# Patient Record
Sex: Female | Born: 1960 | Race: White | Hispanic: No | Marital: Single | Smoking: Current every day smoker
Health system: Southern US, Community
[De-identification: ages and names within clinical notes are randomized; demographics above are authoritative.]

## PROBLEM LIST (undated history)

## (undated) DIAGNOSIS — N200 Calculus of kidney: Secondary | ICD-10-CM

## (undated) DIAGNOSIS — F419 Anxiety disorder, unspecified: Secondary | ICD-10-CM

## (undated) DIAGNOSIS — R51 Headache: Secondary | ICD-10-CM

## (undated) DIAGNOSIS — M1990 Unspecified osteoarthritis, unspecified site: Secondary | ICD-10-CM

## (undated) DIAGNOSIS — M5441 Lumbago with sciatica, right side: Secondary | ICD-10-CM

## (undated) DIAGNOSIS — D519 Vitamin B12 deficiency anemia, unspecified: Secondary | ICD-10-CM

## (undated) DIAGNOSIS — Z8489 Family history of other specified conditions: Secondary | ICD-10-CM

## (undated) DIAGNOSIS — R519 Headache, unspecified: Secondary | ICD-10-CM

## (undated) DIAGNOSIS — I10 Essential (primary) hypertension: Secondary | ICD-10-CM

## (undated) DIAGNOSIS — F4321 Adjustment disorder with depressed mood: Secondary | ICD-10-CM

## (undated) DIAGNOSIS — C801 Malignant (primary) neoplasm, unspecified: Secondary | ICD-10-CM

## (undated) HISTORY — DX: Lumbago with sciatica, right side: M54.41

## (undated) HISTORY — DX: Vitamin B12 deficiency anemia, unspecified: D51.9

## (undated) HISTORY — DX: Adjustment disorder with depressed mood: F43.21

## (undated) HISTORY — PX: LITHOTRIPSY: SUR834

## (undated) HISTORY — PX: TUBAL LIGATION: SHX77

---

## 2015-02-11 ENCOUNTER — Ambulatory Visit: Payer: Self-pay | Admitting: Orthopedic Surgery

## 2015-02-11 NOTE — H&P (Signed)
Krystal Ellis is an 54 y.o. female.   Chief Complaint: back and leg pain HPI: The patient is a 54 year old female who presents with back pain. The patient is here today in referral from Dr. Nelva Bush. The patient reports low back symptoms including low back pain which began year(s) ago without any known injury. Symptoms are reported to be located in the low back and Symptoms include pain and numbness (right toes). The pain radiates to the right buttock, right posterior thigh, right posterior lower leg and right foot. The patient describes the pain as sharp, dull and aching. The patient describes the severity of their symptoms as 5 / 10. Symptoms are exacerbated by standing (prolonged). Current treatment includes nonsteroidal anti-inflammatory drugs (Ibuprofen, Gabapentin, and Lorzone prescribed by her primary care physician) and ice packs. Prior to being seen today the patient was previously evaluated by Dr. Nelva Bush. Past evaluation has included MRI of the lumbar spine. Past treatment has included nonsteroidal anti-inflammatory drugs, opioid analgesics, corticosteroids, activity modification, ice packs, heating pad and chiropractic manipulation.  Krystal Ellis reports some back pain, but predominantly buttock and leg pain into the outer aspect of the foot. She has had an MRI which is indicated disc degeneration at L4-L5 and a disc herniation at L5-S1. She has had epidural steroid injections, activity modifications, strategies to avoid injury. This has been two years in duration. She has been taking gabapentin. Again, reporting more leg pain than back pain.  REVIEW OF SYSTEMS Review of systems is negative for fevers, chest pain, shortness of breath, unexplained recent weight loss, loss of bowel or bladder function, burning with urination, joint swelling, rashes, weakness or numbness, difficulty with balance, easy bruising, excessive thirst or frequent urination.  The patient's pain drawing is organic with an S1  dermatomal pain on the right, rates her pain is 5/10 on visual analog scale.  No past medical history on file.  No past surgical history on file.  No family history on file. Social History:  has no tobacco, alcohol, and drug history on file.  Allergies: Allergies not on file   (Not in a hospital admission)  No results found for this or any previous visit (from the past 48 hour(s)). No results found.  Review of Systems  Constitutional: Negative.   HENT: Negative.   Eyes: Negative.   Respiratory: Negative.   Cardiovascular: Negative.   Gastrointestinal: Negative.   Genitourinary: Negative.   Musculoskeletal: Positive for back pain.  Skin: Negative.   Neurological: Positive for sensory change and focal weakness.  Psychiatric/Behavioral: Negative.     There were no vitals taken for this visit. Physical Exam  Constitutional: She appears well-developed.  HENT:  Head: Normocephalic.  Eyes: Pupils are equal, round, and reactive to light.  Neck: Normal range of motion.  Cardiovascular: Normal rate.   Respiratory: Effort normal.  GI: Soft.  Musculoskeletal:  Moderate stress, walks with an antalgic gait, mood and affect is anxious. Tender in the right proximal gluteus. Straight leg raise buttock, thigh and calf pain on the right, negative on the left. Decreased Achilles reflex on the right compared to the left. Diminished plantar flexion on the right. Pain increased with extension and with sitting.  Lumbar spine exam reveals no evidence of soft tissue swelling, deformity or skin ecchymosis. On palpation there is no tenderness of the lumbar spine. No flank pain with percussion. The abdomen is soft and nontender. Nontender over the trochanters. No cellulitis or lymphadenopathy.  Good range of motion of the lumbar  spine without associated pain. Straight leg raise is negative. Motor is 5/5 including EHL, tibialis anterior, plantar flexion, quadriceps and hamstrings. Patient is  normoreflexic. There is no Babinski or clonus. Sensory exam is intact to light touch. Patient has good distal pulses. No DVT. No pain and normal range of motion without instability of the hips, knees and ankles.  Neurological: She is alert. She displays abnormal reflex.  Skin: Skin is warm and dry.    Three radiographs AP, lateral, flexion and extension demonstrate minimal listhesis at L4-L5 associated disc degeneration of L5-S1, hips are unremarkable.  MRI demonstrates minimal disc degeneration at L4-L5 with a minimal listhesis, small disc protrusion, facet hypertrophy. L5-S1, there is a disc herniation central to the left with bilateral S1 nerve root effacement and facet hypertrophy.  Assessment/Plan 1. 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 2. 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  I had extensive discussion with Davisha concerning current pathology and relevant anatomy and treatment options. She is living with this about two years duration. Treatment and options were discussed, one living with the symptoms and pain management. She does not want to continue to live with this due to the severity of the pain, other options epidural steroid injections, she is not interested in that either. Other operative choice is a microlumbar decompression L5-S1 to the right and perhaps bilaterally given the nature of the disc herniation at L5-S1. There is no L5 nerve root involvement though she does have a disc protrusion at L4-L5. We discussed if her symptoms change it may require procedure at that level. She does not have overwhelming leg pain as opposed to back pain. We did discuss therefore no fusion but the possibility of recurrent disc herniation and requirement for fusion in the future and the need to manage left over symptoms related to that. This  would not address any back pain and could have continued pain given the duration of her symptoms. She is understanding of above and would like to proceed. We will have her scheduled for that procedure. No history of MRSA infection or allergy to penicillin or DVT. We will proceed with a lumbar decompression. I appreciate the kind referral by Dr. Nelva Bush.  Plan microlumbar decompression L5-S1 right, possible left  Trevonne Nyland M. PA-C for Dr. Tonita Cong 02/11/2015, 4:57 PM

## 2015-02-11 NOTE — Progress Notes (Signed)
Please put orders in EPIC as patient has pre-op appointment tomorrow 02/12/2015 at 1100 am! Thank you!

## 2015-02-11 NOTE — Patient Instructions (Addendum)
Sokhna Christoph  02/11/2015   Your procedure is scheduled on: 02/17/2015    Report to [facility]  Entrance take [place]  elevators to 3rd floor to  [place] at     [place] AM.  Call this number if you have problems the morning of surgery [phone]   Remember: ONLY 1 PERSON MAY GO WITH YOU TO SHORT STAY TO GET  READY MORNING OF [place].  Do not eat food or drink liquids :After Midnight.     Take these medicines the morning of surgery with A SIP OF WATER:  Prozac   DO NOT TAKE ANY DIABETIC MEDICATIONS DAY OF YOUR SURGERY                               You may not have any metal on your body including hair pins and              piercings  Do not wear jewelry, make-up, lotions, powders or perfumes, deodorant             Do not wear nail polish.  Do not shave  48 hours prior to surgery.                Do not bring valuables to the hospital. [place].  Contacts, dentures or bridgework may not be worn into surgery.  Leave suitcase in the car. After surgery it may be brought to your room.       Special Instructions: coughing and deep breathing exercises, leg exercises               Please read over the following fact sheets you were given: _____________________________________________________________________             [facility] - Preparing for Surgery Before surgery, you can play an important role.  Because skin is not sterile, your skin needs to be as free of germs as possible.  You can reduce the number of germs on your skin by washing with CHG (chlorahexidine gluconate) soap before surgery.  CHG is an antiseptic cleaner which kills germs and bonds with the skin to continue killing germs even after washing. Please DO NOT use if you have an allergy to CHG or antibacterial soaps.  If your skin becomes reddened/irritated stop using the CHG and inform your nurse when you arrive at Short Stay. Do not  shave (including legs and underarms) for at least 48 hours prior to the first CHG shower.  You may shave your face/neck. Please follow these instructions carefully:  1.  Shower with CHG Soap the night before surgery and the  morning of Surgery.  2.  If you choose to wash your hair, wash your hair first as usual with your  normal  shampoo.  3.  After you shampoo, rinse your hair and body thoroughly to remove the  shampoo.                           4.  Use CHG as you would any other liquid soap.  You can apply chg directly  to the skin and wash  Gently with a scrungie or clean washcloth.  5.  Apply the CHG Soap to your body ONLY FROM THE NECK DOWN.   Do not use on face/ open                           Wound or open sores. Avoid contact with eyes, ears mouth and genitals (private parts).                       Wash face,  Genitals (private parts) with your normal soap.             6.  Wash thoroughly, paying special attention to the area where your surgery  will be performed.  7.  Thoroughly rinse your body with warm water from the neck down.  8.  DO NOT shower/wash with your normal soap after using and rinsing off  the CHG Soap.                9.  Pat yourself dry with a clean towel.            10.  Wear clean pajamas.            11.  Place clean sheets on your bed the night of your first shower and do not  sleep with pets. Day of Surgery : Do not apply any lotions/deodorants the morning of surgery.  Please wear clean clothes to the hospital/surgery center.  FAILURE TO FOLLOW THESE INSTRUCTIONS MAY RESULT IN THE CANCELLATION OF YOUR SURGERY PATIENT SIGNATURE_________________________________  NURSE SIGNATURE__________________________________  ________________________________________________________________________   Adam Phenix  An incentive spirometer is a tool that can help keep your lungs clear and active. This tool measures how well you are filling your lungs  with each breath. Taking long deep breaths may help reverse or decrease the chance of developing breathing (pulmonary) problems (especially infection) following:  A long period of time when you are unable to move or be active. BEFORE THE PROCEDURE   If the spirometer includes an indicator to show your best effort, your nurse or respiratory therapist will set it to a desired goal.  If possible, sit up straight or lean slightly forward. Try not to slouch.  Hold the incentive spirometer in an upright position. INSTRUCTIONS FOR USE  1. Sit on the edge of your bed if possible, or sit up as far as you can in bed or on a chair. 2. Hold the incentive spirometer in an upright position. 3. Breathe out normally. 4. Place the mouthpiece in your mouth and seal your lips tightly around it. 5. Breathe in slowly and as deeply as possible, raising the piston or the ball toward the top of the column. 6. Hold your breath for 3-5 seconds or for as long as possible. Allow the piston or ball to fall to the bottom of the column. 7. Remove the mouthpiece from your mouth and breathe out normally. 8. Rest for a few seconds and repeat Steps 1 through 7 at least 10 times every 1-2 hours when you are awake. Take your time and take a few normal breaths between deep breaths. 9. The spirometer may include an indicator to show your best effort. Use the indicator as a goal to work toward during each repetition. 10. After each set of 10 deep breaths, practice coughing to be sure your lungs are clear. If you have an incision (the cut made at the time of surgery),  support your incision when coughing by placing a pillow or rolled up towels firmly against it. Once you are able to get out of bed, walk around indoors and cough well. You may stop using the incentive spirometer when instructed by your caregiver.  RISKS AND COMPLICATIONS  Take your time so you do not get dizzy or light-headed.  If you are in pain, you may need to  take or ask for pain medication before doing incentive spirometry. It is harder to take a deep breath if you are having pain. AFTER USE  Rest and breathe slowly and easily.  It can be helpful to keep track of a log of your progress. Your caregiver can provide you with a simple table to help with this. If you are using the spirometer at home, follow these instructions: [place] IF:   You are having difficultly using the spirometer.  You have trouble using the spirometer as often as instructed.  Your pain medication is not giving enough relief while using the spirometer.  You develop fever of 100.5 F (38.1 C) or higher. SEEK IMMEDIATE MEDICAL CARE IF:   You cough up bloody sputum that had not been present before.  You develop fever of 102 F (38.9 C) or greater.  You develop worsening pain at or near the incision site. MAKE SURE YOU:   Understand these instructions.  Will watch your condition.  Will get help right away if you are not doing well or get worse. Document Released: 08/07/2006 Document Revised: 06/19/2011 Document Reviewed: 10/08/2006 [facility] Patient Information 2014 [place], [place].   ________________________________________________________________________

## 2015-02-12 ENCOUNTER — Ambulatory Visit (HOSPITAL_COMMUNITY)
Admission: RE | Admit: 2015-02-12 | Discharge: 2015-02-12 | Disposition: A | Discharge status: Home / Self Care | Payer: BC Managed Care – PPO | Source: Ambulatory Visit | Attending: Orthopedic Surgery | Admitting: Orthopedic Surgery

## 2015-02-12 ENCOUNTER — Encounter (HOSPITAL_COMMUNITY)
Admission: RE | Admit: 2015-02-12 | Discharge: 2015-02-12 | Disposition: A | Discharge status: Home / Self Care | Payer: BC Managed Care – PPO | Source: Ambulatory Visit | Attending: Specialist | Admitting: Specialist

## 2015-02-12 ENCOUNTER — Encounter (HOSPITAL_COMMUNITY): Payer: Self-pay

## 2015-02-12 DIAGNOSIS — M4316 Spondylolisthesis, lumbar region: Secondary | ICD-10-CM | POA: Insufficient documentation

## 2015-02-12 DIAGNOSIS — M5136 Other intervertebral disc degeneration, lumbar region: Secondary | ICD-10-CM | POA: Insufficient documentation

## 2015-02-12 DIAGNOSIS — Z01812 Encounter for preprocedural laboratory examination: Secondary | ICD-10-CM | POA: Diagnosis not present

## 2015-02-12 DIAGNOSIS — M5126 Other intervertebral disc displacement, lumbar region: Secondary | ICD-10-CM | POA: Insufficient documentation

## 2015-02-12 DIAGNOSIS — Z01818 Encounter for other preprocedural examination: Secondary | ICD-10-CM | POA: Diagnosis not present

## 2015-02-12 DIAGNOSIS — R9431 Abnormal electrocardiogram [ECG] [EKG]: Secondary | ICD-10-CM | POA: Diagnosis not present

## 2015-02-12 HISTORY — DX: Calculus of kidney: N20.0

## 2015-02-12 HISTORY — DX: Malignant (primary) neoplasm, unspecified: C80.1

## 2015-02-12 HISTORY — DX: Unspecified osteoarthritis, unspecified site: M19.90

## 2015-02-12 HISTORY — DX: Family history of other specified conditions: Z84.89

## 2015-02-12 HISTORY — DX: Essential (primary) hypertension: I10

## 2015-02-12 HISTORY — DX: Headache: R51

## 2015-02-12 HISTORY — DX: Anxiety disorder, unspecified: F41.9

## 2015-02-12 HISTORY — DX: Headache, unspecified: R51.9

## 2015-02-12 LAB — BASIC METABOLIC PANEL
Anion gap: 8 (ref 5–15)
BUN: 15 mg/dL (ref 6–20)
CO2: 26 mmol/L (ref 22–32)
Calcium: 9.6 mg/dL (ref 8.9–10.3)
Chloride: 103 mmol/L (ref 101–111)
Creatinine, Ser: 0.57 mg/dL (ref 0.44–1.00)
GFR calc Af Amer: >60 mL/min (ref >60)
Glucose, Bld: 98 mg/dL (ref 65–99)
POTASSIUM: 4 mmol/L (ref 3.5–5.1)
SODIUM: 137 mmol/L (ref 135–145)

## 2015-02-12 LAB — CBC
HEMATOCRIT: 39.4 % (ref 36.0–46.0)
Hemoglobin: 13.3 g/dL (ref 12.0–15.0)
MCH: 31.3 pg (ref 26.0–34.0)
MCHC: 33.8 g/dL (ref 30.0–36.0)
MCV: 92.7 fL (ref 78.0–100.0)
PLATELETS: 304 10*3/uL (ref 150–400)
RBC: 4.25 MIL/uL (ref 3.87–5.11)
RDW: 12.6 % (ref 11.5–15.5)
WBC: 8.6 10*3/uL (ref 4.0–10.5)

## 2015-02-12 LAB — SURGICAL PCR SCREEN
MRSA, PCR: NEGATIVE
Staphylococcus aureus: NEGATIVE

## 2015-02-12 LAB — HCG, SERUM, QUALITATIVE: Preg, Serum: NEGATIVE

## 2015-02-15 NOTE — Progress Notes (Signed)
Final EKG in EPIC done 02/12/15.

## 2015-02-17 ENCOUNTER — Ambulatory Visit (HOSPITAL_COMMUNITY): Payer: BC Managed Care – PPO

## 2015-02-17 ENCOUNTER — Encounter (HOSPITAL_COMMUNITY): Payer: Self-pay | Admitting: *Deleted

## 2015-02-17 ENCOUNTER — Ambulatory Visit (HOSPITAL_COMMUNITY): Payer: BC Managed Care – PPO | Admitting: Anesthesiology

## 2015-02-17 ENCOUNTER — Encounter (HOSPITAL_COMMUNITY)
Admission: RE | Disposition: A | Discharge status: Home / Self Care | Payer: Self-pay | Source: Ambulatory Visit | Attending: Specialist

## 2015-02-17 ENCOUNTER — Ambulatory Visit (HOSPITAL_COMMUNITY)
Admission: RE | Admit: 2015-02-17 | Discharge: 2015-02-19 | Disposition: A | Discharge status: Home / Self Care | Payer: BC Managed Care – PPO | Source: Ambulatory Visit | Attending: Specialist | Admitting: Specialist

## 2015-02-17 DIAGNOSIS — Z87891 Personal history of nicotine dependence: Secondary | ICD-10-CM | POA: Insufficient documentation

## 2015-02-17 DIAGNOSIS — M48061 Spinal stenosis, lumbar region without neurogenic claudication: Secondary | ICD-10-CM

## 2015-02-17 DIAGNOSIS — I10 Essential (primary) hypertension: Secondary | ICD-10-CM | POA: Diagnosis not present

## 2015-02-17 DIAGNOSIS — Z419 Encounter for procedure for purposes other than remedying health state, unspecified: Secondary | ICD-10-CM

## 2015-02-17 DIAGNOSIS — M549 Dorsalgia, unspecified: Secondary | ICD-10-CM

## 2015-02-17 DIAGNOSIS — M5117 Intervertebral disc disorders with radiculopathy, lumbosacral region: Secondary | ICD-10-CM | POA: Insufficient documentation

## 2015-02-17 DIAGNOSIS — M1990 Unspecified osteoarthritis, unspecified site: Secondary | ICD-10-CM | POA: Diagnosis not present

## 2015-02-17 DIAGNOSIS — IMO0002 Reserved for concepts with insufficient information to code with codable children: Secondary | ICD-10-CM

## 2015-02-17 DIAGNOSIS — M4807 Spinal stenosis, lumbosacral region: Secondary | ICD-10-CM | POA: Insufficient documentation

## 2015-02-17 DIAGNOSIS — M5116 Intervertebral disc disorders with radiculopathy, lumbar region: Secondary | ICD-10-CM | POA: Insufficient documentation

## 2015-02-17 DIAGNOSIS — M545 Low back pain: Secondary | ICD-10-CM | POA: Diagnosis present

## 2015-02-17 HISTORY — DX: Spinal stenosis, lumbar region without neurogenic claudication: M48.061

## 2015-02-17 HISTORY — PX: LUMBAR LAMINECTOMY/DECOMPRESSION MICRODISCECTOMY: SHX5026

## 2015-02-17 SURGERY — LUMBAR LAMINECTOMY/DECOMPRESSION MICRODISCECTOMY 1 LEVEL
Anesthesia: General | Laterality: Bilateral

## 2015-02-17 MED ORDER — CEFAZOLIN SODIUM-DEXTROSE 2-3 GM-% IV SOLR
2.0000 g | INTRAVENOUS | Status: AC
  Administered 2015-02-17: 2 g via INTRAVENOUS

## 2015-02-17 MED ORDER — ONDANSETRON HCL 4 MG/2ML IJ SOLN
INTRAMUSCULAR | Status: AC
  Filled 2015-02-17: qty 2

## 2015-02-17 MED ORDER — ACETAMINOPHEN 650 MG RE SUPP: 650.0000 mg | RECTAL | Status: DC | PRN

## 2015-02-17 MED ORDER — EPHEDRINE SULFATE 50 MG/ML IJ SOLN
INTRAMUSCULAR | Status: AC
  Filled 2015-02-17: qty 1

## 2015-02-17 MED ORDER — GABAPENTIN 300 MG PO CAPS
300.0000 mg | ORAL_CAPSULE | Freq: Two times a day (BID) | ORAL | Status: DC
  Administered 2015-02-17 – 2015-02-19 (×4): 300 mg via ORAL
  Filled 2015-02-17 (×6): qty 1

## 2015-02-17 MED ORDER — AMLODIPINE BESYLATE 5 MG PO TABS
5.0000 mg | ORAL_TABLET | Freq: Every day | ORAL | Status: DC
  Administered 2015-02-17: 5 mg via ORAL
  Filled 2015-02-17 (×3): qty 1

## 2015-02-17 MED ORDER — VARENICLINE TARTRATE 1 MG PO TABS
1.0000 mg | ORAL_TABLET | Freq: Two times a day (BID) | ORAL | Status: DC
  Administered 2015-02-17 – 2015-02-19 (×4): 1 mg via ORAL
  Filled 2015-02-17 (×6): qty 1

## 2015-02-17 MED ORDER — PROPOFOL 10 MG/ML IV BOLUS
INTRAVENOUS | Status: AC
  Filled 2015-02-17: qty 20

## 2015-02-17 MED ORDER — PROPOFOL 10 MG/ML IV BOLUS
INTRAVENOUS | Status: DC | PRN
  Administered 2015-02-17: 150 mg via INTRAVENOUS
  Administered 2015-02-17: 50 mg via INTRAVENOUS

## 2015-02-17 MED ORDER — VARENICLINE TARTRATE 0.5 MG X 11 & 1 MG X 42 PO MISC: 1.0000 | Freq: Two times a day (BID) | ORAL | Status: DC

## 2015-02-17 MED ORDER — SODIUM CHLORIDE 0.9 % IJ SOLN
INTRAMUSCULAR | Status: AC
  Filled 2015-02-17: qty 20

## 2015-02-17 MED ORDER — HYDROMORPHONE HCL 1 MG/ML IJ SOLN
INTRAMUSCULAR | Status: AC
  Filled 2015-02-17: qty 1

## 2015-02-17 MED ORDER — PROMETHAZINE HCL 25 MG/ML IJ SOLN: 6.2500 mg | INTRAMUSCULAR | Status: DC | PRN

## 2015-02-17 MED ORDER — ALUM & MAG HYDROXIDE-SIMETH 200-200-20 MG/5ML PO SUSP: 30.0000 mL | Freq: Four times a day (QID) | ORAL | Status: DC | PRN

## 2015-02-17 MED ORDER — ROCURONIUM BROMIDE 100 MG/10ML IV SOLN
INTRAVENOUS | Status: AC
  Filled 2015-02-17: qty 1

## 2015-02-17 MED ORDER — KCL IN DEXTROSE-NACL 20-5-0.45 MEQ/L-%-% IV SOLN
INTRAVENOUS | Status: AC
  Administered 2015-02-17 – 2015-02-18 (×2): via INTRAVENOUS
  Filled 2015-02-17 (×2): qty 1000

## 2015-02-17 MED ORDER — MIDAZOLAM HCL 5 MG/5ML IJ SOLN
INTRAMUSCULAR | Status: DC | PRN
  Administered 2015-02-17: 2 mg via INTRAVENOUS

## 2015-02-17 MED ORDER — BUPIVACAINE-EPINEPHRINE (PF) 0.5% -1:200000 IJ SOLN
INTRAMUSCULAR | Status: AC
  Filled 2015-02-17: qty 30

## 2015-02-17 MED ORDER — SODIUM CHLORIDE 0.9 % IR SOLN
Status: DC | PRN
  Administered 2015-02-17: 500 mL

## 2015-02-17 MED ORDER — SENNOSIDES-DOCUSATE SODIUM 8.6-50 MG PO TABS: 1.0000 | ORAL_TABLET | Freq: Every evening | ORAL | Status: DC | PRN

## 2015-02-17 MED ORDER — CHLORZOXAZONE 500 MG PO TABS
500.0000 mg | ORAL_TABLET | Freq: Four times a day (QID) | ORAL | Status: DC
Start: 2015-02-17 — End: 2015-02-19
  Administered 2015-02-17 (×2): 750 mg via ORAL
  Administered 2015-02-18: 500 mg via ORAL
  Filled 2015-02-17 (×11): qty 2

## 2015-02-17 MED ORDER — LIDOCAINE HCL (CARDIAC) 20 MG/ML IV SOLN
INTRAVENOUS | Status: DC | PRN
  Administered 2015-02-17: 80 mg via INTRAVENOUS

## 2015-02-17 MED ORDER — EPHEDRINE 5 MG/ML INJ
INTRAVENOUS | Status: DC | PRN
  Administered 2015-02-17 (×2): 10 mg via INTRAVENOUS

## 2015-02-17 MED ORDER — METHOCARBAMOL 1000 MG/10ML IJ SOLN
500.0000 mg | Freq: Four times a day (QID) | INTRAMUSCULAR | Status: DC | PRN
  Administered 2015-02-17: 500 mg via INTRAVENOUS
  Filled 2015-02-17 (×2): qty 5

## 2015-02-17 MED ORDER — OXYCODONE-ACETAMINOPHEN 5-325 MG PO TABS: 1.0000 | ORAL_TABLET | ORAL | Status: DC | PRN

## 2015-02-17 MED ORDER — ONDANSETRON HCL 4 MG/2ML IJ SOLN
INTRAMUSCULAR | Status: DC | PRN
  Administered 2015-02-17: 4 mg via INTRAVENOUS

## 2015-02-17 MED ORDER — IRBESARTAN 300 MG PO TABS
300.0000 mg | ORAL_TABLET | Freq: Every day | ORAL | Status: DC
  Administered 2015-02-17: 300 mg via ORAL
  Filled 2015-02-17 (×3): qty 1

## 2015-02-17 MED ORDER — ONDANSETRON HCL 4 MG/2ML IJ SOLN
4.0000 mg | INTRAMUSCULAR | Status: DC | PRN
  Administered 2015-02-18: 4 mg via INTRAVENOUS
  Filled 2015-02-17: qty 2

## 2015-02-17 MED ORDER — ACETAMINOPHEN 325 MG PO TABS: 650.0000 mg | ORAL_TABLET | ORAL | Status: DC | PRN

## 2015-02-17 MED ORDER — SUFENTANIL CITRATE 50 MCG/ML IV SOLN
INTRAVENOUS | Status: AC
  Filled 2015-02-17: qty 1

## 2015-02-17 MED ORDER — HYDROMORPHONE HCL 1 MG/ML IJ SOLN
0.5000 mg | INTRAMUSCULAR | Status: DC | PRN
  Administered 2015-02-17: 1 mg via INTRAVENOUS
  Administered 2015-02-17: 0.5 mg via INTRAVENOUS
  Filled 2015-02-17 (×2): qty 1

## 2015-02-17 MED ORDER — BUPIVACAINE-EPINEPHRINE 0.5% -1:200000 IJ SOLN
INTRAMUSCULAR | Status: DC | PRN
Start: 2015-02-17 — End: 2015-02-17
  Administered 2015-02-17: 10 mL

## 2015-02-17 MED ORDER — PHENOL 1.4 % MT LIQD: 1.0000 | OROMUCOSAL | Status: DC | PRN

## 2015-02-17 MED ORDER — DOCUSATE SODIUM 100 MG PO CAPS: 100.0000 mg | ORAL_CAPSULE | Freq: Two times a day (BID) | ORAL | Status: DC | PRN

## 2015-02-17 MED ORDER — HYDROCHLOROTHIAZIDE 25 MG PO TABS
25.0000 mg | ORAL_TABLET | Freq: Every day | ORAL | Status: DC
  Administered 2015-02-17: 25 mg via ORAL
  Filled 2015-02-17 (×3): qty 1

## 2015-02-17 MED ORDER — SUGAMMADEX SODIUM 200 MG/2ML IV SOLN
INTRAVENOUS | Status: DC | PRN
  Administered 2015-02-17: 160 mg via INTRAVENOUS

## 2015-02-17 MED ORDER — LIDOCAINE HCL (CARDIAC) 20 MG/ML IV SOLN
INTRAVENOUS | Status: AC
  Filled 2015-02-17: qty 5

## 2015-02-17 MED ORDER — HYDROMORPHONE HCL 1 MG/ML IJ SOLN
0.2500 mg | INTRAMUSCULAR | Status: DC | PRN
  Administered 2015-02-17 (×4): 0.5 mg via INTRAVENOUS

## 2015-02-17 MED ORDER — AMLODIPINE BESYLATE 10 MG PO TABS
40.0000 mg | ORAL_TABLET | Freq: Once | ORAL | Status: AC
  Administered 2015-02-17: 40 mg via ORAL
  Filled 2015-02-17: qty 4

## 2015-02-17 MED ORDER — FLEET ENEMA 7-19 GM/118ML RE ENEM: 1.0000 | ENEMA | Freq: Once | RECTAL | Status: DC | PRN

## 2015-02-17 MED ORDER — THROMBIN 5000 UNITS EX SOLR
CUTANEOUS | Status: AC
  Filled 2015-02-17: qty 10000

## 2015-02-17 MED ORDER — MIDAZOLAM HCL 2 MG/2ML IJ SOLN
INTRAMUSCULAR | Status: AC
  Filled 2015-02-17: qty 4

## 2015-02-17 MED ORDER — LACTATED RINGERS IV SOLN
INTRAVENOUS | Status: DC
  Administered 2015-02-17: 08:00:00 via INTRAVENOUS

## 2015-02-17 MED ORDER — THROMBIN 5000 UNITS EX SOLR
CUTANEOUS | Status: DC | PRN
  Administered 2015-02-17: 10:00:00 via TOPICAL

## 2015-02-17 MED ORDER — BISACODYL 5 MG PO TBEC: 5.0000 mg | DELAYED_RELEASE_TABLET | Freq: Every day | ORAL | Status: DC | PRN

## 2015-02-17 MED ORDER — RISAQUAD PO CAPS
1.0000 | ORAL_CAPSULE | Freq: Every day | ORAL | Status: DC
  Administered 2015-02-17 – 2015-02-19 (×3): 1 via ORAL
  Filled 2015-02-17 (×3): qty 1

## 2015-02-17 MED ORDER — SUGAMMADEX SODIUM 200 MG/2ML IV SOLN
INTRAVENOUS | Status: AC
  Filled 2015-02-17: qty 2

## 2015-02-17 MED ORDER — PHENYLEPHRINE HCL 10 MG/ML IJ SOLN
INTRAMUSCULAR | Status: DC | PRN
  Administered 2015-02-17 (×2): 80 ug via INTRAVENOUS

## 2015-02-17 MED ORDER — OLMESARTAN-AMLODIPINE-HCTZ 40-5-25 MG PO TABS: 1.0000 | ORAL_TABLET | Freq: Every day | ORAL | Status: DC

## 2015-02-17 MED ORDER — FLUOXETINE HCL 10 MG PO CAPS
10.0000 mg | ORAL_CAPSULE | Freq: Every day | ORAL | Status: DC
  Administered 2015-02-18 – 2015-02-19 (×2): 10 mg via ORAL
  Filled 2015-02-17 (×2): qty 1

## 2015-02-17 MED ORDER — DOCUSATE SODIUM 100 MG PO CAPS
100.0000 mg | ORAL_CAPSULE | Freq: Two times a day (BID) | ORAL | Status: DC
  Administered 2015-02-17 – 2015-02-19 (×4): 100 mg via ORAL

## 2015-02-17 MED ORDER — MENTHOL 3 MG MT LOZG: 1.0000 | LOZENGE | OROMUCOSAL | Status: DC | PRN

## 2015-02-17 MED ORDER — SUFENTANIL CITRATE 50 MCG/ML IV SOLN
INTRAVENOUS | Status: DC | PRN
  Administered 2015-02-17: 5 ug via INTRAVENOUS
  Administered 2015-02-17: 20 ug via INTRAVENOUS

## 2015-02-17 MED ORDER — SODIUM CHLORIDE 0.9 % IR SOLN
Status: AC
  Filled 2015-02-17: qty 1

## 2015-02-17 MED ORDER — HYDROCODONE-ACETAMINOPHEN 5-325 MG PO TABS: 1.0000 | ORAL_TABLET | ORAL | Status: DC | PRN

## 2015-02-17 MED ORDER — CEFAZOLIN SODIUM-DEXTROSE 2-3 GM-% IV SOLR
INTRAVENOUS | Status: AC
  Filled 2015-02-17: qty 50

## 2015-02-17 MED ORDER — METHOCARBAMOL 500 MG PO TABS: 500.0000 mg | ORAL_TABLET | Freq: Three times a day (TID) | ORAL | Status: DC | PRN

## 2015-02-17 MED ORDER — METHOCARBAMOL 500 MG PO TABS
500.0000 mg | ORAL_TABLET | Freq: Four times a day (QID) | ORAL | Status: DC | PRN
Start: 2015-02-17 — End: 2015-02-19
  Administered 2015-02-17: 500 mg via ORAL
  Filled 2015-02-17: qty 1

## 2015-02-17 MED ORDER — ROCURONIUM BROMIDE 100 MG/10ML IV SOLN
INTRAVENOUS | Status: DC | PRN
  Administered 2015-02-17 (×2): 10 mg via INTRAVENOUS
  Administered 2015-02-17: 40 mg via INTRAVENOUS

## 2015-02-17 MED ORDER — CEFAZOLIN SODIUM-DEXTROSE 2-3 GM-% IV SOLR
2.0000 g | Freq: Three times a day (TID) | INTRAVENOUS | Status: AC
  Administered 2015-02-17 – 2015-02-18 (×3): 2 g via INTRAVENOUS
  Filled 2015-02-17 (×3): qty 50

## 2015-02-17 MED ORDER — OXYCODONE-ACETAMINOPHEN 5-325 MG PO TABS
1.0000 | ORAL_TABLET | ORAL | Status: DC | PRN
  Administered 2015-02-18 – 2015-02-19 (×3): 1 via ORAL
  Filled 2015-02-17 (×3): qty 1

## 2015-02-17 SURGICAL SUPPLY — 48 items
BAG ZIPLOCK 12X15 (MISCELLANEOUS) IMPLANT
CLEANER TIP ELECTROSURG 2X2 (MISCELLANEOUS) ×3 IMPLANT
CLOSURE WOUND 1/2 X4 (GAUZE/BANDAGES/DRESSINGS) ×1
CLOTH 2% CHLOROHEXIDINE 3PK (PERSONAL CARE ITEMS) ×3 IMPLANT
DRAPE MICROSCOPE LEICA (MISCELLANEOUS) ×3 IMPLANT
DRAPE POUCH INSTRU U-SHP 10X18 (DRAPES) ×3 IMPLANT
DRAPE SHEET LG 3/4 BI-LAMINATE (DRAPES) ×3 IMPLANT
DRAPE SURG 17X11 SM STRL (DRAPES) ×3 IMPLANT
DRAPE UTILITY XL STRL (DRAPES) ×3 IMPLANT
DRSG AQUACEL AG ADV 3.5X 4 (GAUZE/BANDAGES/DRESSINGS) ×3 IMPLANT
DRSG AQUACEL AG ADV 3.5X 6 (GAUZE/BANDAGES/DRESSINGS) IMPLANT
DRSG TELFA 3X8 NADH (GAUZE/BANDAGES/DRESSINGS) ×3 IMPLANT
DURAPREP 26ML APPLICATOR (WOUND CARE) ×3 IMPLANT
DURASEAL SPINE SEALANT 3ML (MISCELLANEOUS) IMPLANT
ELECT BLADE TIP CTD 4 INCH (ELECTRODE) IMPLANT
ELECT REM PT RETURN 9FT ADLT (ELECTROSURGICAL) ×3
ELECTRODE REM PT RTRN 9FT ADLT (ELECTROSURGICAL) ×1 IMPLANT
GLOVE BIOGEL PI IND STRL 7.0 (GLOVE) ×1 IMPLANT
GLOVE BIOGEL PI INDICATOR 7.0 (GLOVE) ×2
GLOVE SURG SS PI 7.0 STRL IVOR (GLOVE) ×3 IMPLANT
GLOVE SURG SS PI 7.5 STRL IVOR (GLOVE) ×3 IMPLANT
GLOVE SURG SS PI 8.0 STRL IVOR (GLOVE) ×6 IMPLANT
GOWN STRL REUS W/TWL XL LVL3 (GOWN DISPOSABLE) ×6 IMPLANT
IV CATH 14GX2 1/4 (CATHETERS) ×3 IMPLANT
KIT BASIN OR (CUSTOM PROCEDURE TRAY) ×3 IMPLANT
KIT POSITIONING SURG ANDREWS (MISCELLANEOUS) ×3 IMPLANT
MANIFOLD NEPTUNE II (INSTRUMENTS) ×3 IMPLANT
NEEDLE SPNL 18GX3.5 QUINCKE PK (NEEDLE) ×6 IMPLANT
PACK LAMINECTOMY ORTHO (CUSTOM PROCEDURE TRAY) ×3 IMPLANT
PATTIES SURGICAL .5 X.5 (GAUZE/BANDAGES/DRESSINGS) IMPLANT
PATTIES SURGICAL .75X.75 (GAUZE/BANDAGES/DRESSINGS) ×3 IMPLANT
PATTIES SURGICAL 1X1 (DISPOSABLE) IMPLANT
RUBBERBAND STERILE (MISCELLANEOUS) ×6 IMPLANT
SPONGE SURGIFOAM ABS GEL 100 (HEMOSTASIS) ×3 IMPLANT
STAPLER VISISTAT (STAPLE) IMPLANT
STRIP CLOSURE SKIN 1/2X4 (GAUZE/BANDAGES/DRESSINGS) ×2 IMPLANT
SUT NURALON 4 0 TR CR/8 (SUTURE) IMPLANT
SUT PROLENE 3 0 PS 2 (SUTURE) ×3 IMPLANT
SUT VIC AB 1 CT1 27 (SUTURE) ×2
SUT VIC AB 1 CT1 27XBRD ANTBC (SUTURE) ×1 IMPLANT
SUT VIC AB 1-0 CT2 27 (SUTURE) ×6 IMPLANT
SUT VIC AB 2-0 CT1 27 (SUTURE)
SUT VIC AB 2-0 CT1 TAPERPNT 27 (SUTURE) IMPLANT
SUT VIC AB 2-0 CT2 27 (SUTURE) ×6 IMPLANT
SYR 3ML LL SCALE MARK (SYRINGE) IMPLANT
TOWEL OR 17X26 10 PK STRL BLUE (TOWEL DISPOSABLE) ×3 IMPLANT
TOWEL OR NON WOVEN STRL DISP B (DISPOSABLE) IMPLANT
YANKAUER SUCT BULB TIP NO VENT (SUCTIONS) IMPLANT

## 2015-02-17 NOTE — Anesthesia Procedure Notes (Signed)
Procedure Name: Intubation Date/Time: 02/17/2015 8:37 AM Performed by: Chyrel Masson Pre-anesthesia Checklist: Patient identified, Emergency Drugs available, Suction available, Patient being monitored and Timeout performed Patient Re-evaluated:Patient Re-evaluated prior to inductionOxygen Delivery Method: Circle system utilized and Simple face mask Preoxygenation: Pre-oxygenation with 100% oxygen Intubation Type: IV induction Ventilation: Mask ventilation without difficulty Laryngoscope Size: Mac and 4 Grade View: Grade I Tube type: Oral Tube size: 7.5 mm Number of attempts: 1 Airway Equipment and Method: Stylet Placement Confirmation: ETT inserted through vocal cords under direct vision,  positive ETCO2 and breath sounds checked- equal and bilateral Secured at: 0.1 cm Tube secured with: Tape Dental Injury: Teeth and Oropharynx as per pre-operative assessment

## 2015-02-17 NOTE — Transfer of Care (Signed)
Immediate Anesthesia Transfer of Care Note  Patient: Krystal Ellis  Procedure(s) Performed: Procedure(s): MICRO LUMBAR DECOMPRESSION L5 - S1 ON RIGHT POSSIBLE LEFT (Bilateral)  Patient Location: PACU  Anesthesia Type:General  Level of Consciousness: awake, alert  and oriented  Airway & Oxygen Therapy: Patient Spontanous Breathing and Patient connected to face mask oxygen  Post-op Assessment: Report given to RN and Post -op Vital signs reviewed and stable  Post vital signs: Reviewed and stable  Last Vitals:  Filed Vitals:   02/17/15 0640  BP: 134/58  Pulse: 77  Temp: 36.7 C  Resp: 18    Complications: No apparent anesthesia complications

## 2015-02-17 NOTE — Brief Op Note (Signed)
02/17/2015  10:22 AM  PATIENT:  [name]  54 y.o. female  PRE-OPERATIVE DIAGNOSIS:  HNP, STENOSIS L5 - S1  POST-OPERATIVE DIAGNOSIS:  HNP, STENOSIS L5 - S1  PROCEDURE:  Procedure(s): MICRO LUMBAR DECOMPRESSION L5 - S1 ON RIGHT POSSIBLE LEFT (Bilateral)  SURGEON:  Surgeon(s) and Role:    * Susa Day, MD - Primary  PHYSICIAN ASSISTANT:   ASSISTANTS: [place]   ANESTHESIA:   general  EBL:  Total I/O In: 1000 [I.V.:1000] Out: 200 [Urine:100; Blood:100]  BLOOD ADMINISTERED:none  DRAINS: none   LOCAL MEDICATIONS USED:  MARCAINE     SPECIMEN:  Source of Specimen:  L5S1  DISPOSITION OF SPECIMEN:  PATHOLOGY  COUNTS:  YES  TOURNIQUET:  * No tourniquets in log *  DICTATION: .Note written in EPIC #no [id number]  PLAN OF CARE: Admit for overnight observation  PATIENT DISPOSITION:  PACU - hemodynamically stable.   Delay start of Pharmacological VTE agent (>24hrs) due to surgical blood loss or risk of bleeding: yes

## 2015-02-17 NOTE — H&P (View-Only) (Signed)
Krystal Ellis is an 54 y.o. female.   Chief Complaint: back and leg pain HPI: The patient is a 54 year old female who presents with back pain. The patient is here today in referral from Dr. Nelva Ellis. The patient reports low back symptoms including low back pain which began year(s) ago without any known injury. Symptoms are reported to be located in the low back and Symptoms include pain and numbness (right toes). The pain radiates to the right buttock, right posterior thigh, right posterior lower leg and right foot. The patient describes the pain as sharp, dull and aching. The patient describes the severity of their symptoms as 5 / 10. Symptoms are exacerbated by standing (prolonged). Current treatment includes nonsteroidal anti-inflammatory drugs (Ibuprofen, Gabapentin, and Lorzone prescribed by her primary care physician) and ice packs. Prior to being seen today the patient was previously evaluated by Dr. Nelva Ellis. Past evaluation has included MRI of the lumbar spine. Past treatment has included nonsteroidal anti-inflammatory drugs, opioid analgesics, corticosteroids, activity modification, ice packs, heating pad and chiropractic manipulation.  Krystal Ellis reports some back pain, but predominantly buttock and leg pain into the outer aspect of the foot. She has had an MRI which is indicated disc degeneration at L4-L5 and a disc herniation at L5-S1. She has had epidural steroid injections, activity modifications, strategies to avoid injury. This has been two years in duration. She has been taking gabapentin. Again, reporting more leg pain than back pain.  REVIEW OF SYSTEMS Review of systems is negative for fevers, chest pain, shortness of breath, unexplained recent weight loss, loss of bowel or bladder function, burning with urination, joint swelling, rashes, weakness or numbness, difficulty with balance, easy bruising, excessive thirst or frequent urination.  The patient's pain drawing is organic with an S1  dermatomal pain on the right, rates her pain is 5/10 on visual analog scale.  No past medical history on file.  No past surgical history on file.  No family history on file. Social History:  has no tobacco, alcohol, and drug history on file.  Allergies: Allergies not on file   (Not in a hospital admission)  No results found for this or any previous visit (from the past 48 hour(s)). No results found.  Review of Systems  Constitutional: Negative.   HENT: Negative.   Eyes: Negative.   Respiratory: Negative.   Cardiovascular: Negative.   Gastrointestinal: Negative.   Genitourinary: Negative.   Musculoskeletal: Positive for back pain.  Skin: Negative.   Neurological: Positive for sensory change and focal weakness.  Psychiatric/Behavioral: Negative.     There were no vitals taken for this visit. Physical Exam  Constitutional: She appears well-developed.  HENT:  Head: Normocephalic.  Eyes: Pupils are equal, round, and reactive to light.  Neck: Normal range of motion.  Cardiovascular: Normal rate.   Respiratory: Effort normal.  GI: Soft.  Musculoskeletal:  Moderate stress, walks with an antalgic gait, mood and affect is anxious. Tender in the right proximal gluteus. Straight leg raise buttock, thigh and calf pain on the right, negative on the left. Decreased Achilles reflex on the right compared to the left. Diminished plantar flexion on the right. Pain increased with extension and with sitting.  Lumbar spine exam reveals no evidence of soft tissue swelling, deformity or skin ecchymosis. On palpation there is no tenderness of the lumbar spine. No flank pain with percussion. The abdomen is soft and nontender. Nontender over the trochanters. No cellulitis or lymphadenopathy.  Good range of motion of the lumbar  spine without associated pain. Straight leg raise is negative. Motor is 5/5 including EHL, tibialis anterior, plantar flexion, quadriceps and hamstrings. Patient is  normoreflexic. There is no Babinski or clonus. Sensory exam is intact to light touch. Patient has good distal pulses. No DVT. No pain and normal range of motion without instability of the hips, knees and ankles.  Neurological: She is alert. She displays abnormal reflex.  Skin: Skin is warm and dry.    Three radiographs AP, lateral, flexion and extension demonstrate minimal listhesis at L4-L5 associated disc degeneration of L5-S1, hips are unremarkable.  MRI demonstrates minimal disc degeneration at L4-L5 with a minimal listhesis, small disc protrusion, facet hypertrophy. L5-S1, there is a disc herniation central to the left with bilateral S1 nerve root effacement and facet hypertrophy.  Assessment/Plan 1. 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 2. 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  I had extensive discussion with Krystal Ellis concerning current pathology and relevant anatomy and treatment options. She is living with this about two years duration. Treatment and options were discussed, one living with the symptoms and pain management. She does not want to continue to live with this due to the severity of the pain, other options epidural steroid injections, she is not interested in that either. Other operative choice is a microlumbar decompression L5-S1 to the right and perhaps bilaterally given the nature of the disc herniation at L5-S1. There is no L5 nerve root involvement though she does have a disc protrusion at L4-L5. We discussed if her symptoms change it may require procedure at that level. She does not have overwhelming leg pain as opposed to back pain. We did discuss therefore no fusion but the possibility of recurrent disc herniation and requirement for fusion in the future and the need to manage left over symptoms related to that. This  would not address any back pain and could have continued pain given the duration of her symptoms. She is understanding of above and would like to proceed. We will have her scheduled for that procedure. No history of MRSA infection or allergy to penicillin or DVT. We will proceed with a lumbar decompression. I appreciate the kind referral by Dr. Nelva Ellis.  Plan microlumbar decompression L5-S1 right, possible left  BISSELL, JACLYN M. PA-C for Dr. Tonita Cong 02/11/2015, 4:57 PM

## 2015-02-17 NOTE — Interval H&P Note (Signed)
History and Physical Interval Note:  02/17/2015 8:23 AM  Krystal Ellis  has presented today for surgery, with the diagnosis of HNP, STENOSIS L5 - S1  The various methods of treatment have been discussed with the patient and family. After consideration of risks, benefits and other options for treatment, the patient has consented to  Procedure(s): MICRO LUMBAR DECOMPRESSION L5 - S1 ON RIGHT POSSIBLE LEFT (Bilateral) as a surgical intervention .  The patient's history has been reviewed, patient examined, no change in status, stable for surgery.  I have reviewed the patient's chart and labs.  Questions were answered to the patient's satisfaction.     [name] C

## 2015-02-17 NOTE — Anesthesia Preprocedure Evaluation (Signed)
Anesthesia Evaluation  Patient identified by MRN, date of birth, ID band Patient awake    Reviewed: Allergy & Precautions, NPO status , Patient's Chart, lab work & pertinent test results  History of Anesthesia Complications (+) Family history of anesthesia reaction  Airway Mallampati: II  TM Distance: >3 FB Neck ROM: Full    Dental no notable dental hx.    Pulmonary former smoker,    Pulmonary exam normal breath sounds clear to auscultation       Cardiovascular hypertension, Pt. on medications Normal cardiovascular exam Rhythm:Regular Rate:Normal     Neuro/Psych  Headaches, Anxiety    GI/Hepatic negative GI ROS, Neg liver ROS,   Endo/Other  negative endocrine ROS  Renal/GU Renal disease  negative genitourinary   Musculoskeletal  (+) Arthritis ,   Abdominal   Peds negative pediatric ROS (+)  Hematology negative hematology ROS (+)   Anesthesia Other Findings   Reproductive/Obstetrics negative OB ROS                             Anesthesia Physical Anesthesia Plan  ASA: II  Anesthesia Plan: General   Post-op Pain Management:    Induction: Intravenous  Airway Management Planned: Oral ETT  Additional Equipment:   Intra-op Plan:   Post-operative Plan: Extubation in OR  Informed Consent: I have reviewed the patients History and Physical, chart, labs and discussed the procedure including the risks, benefits and alternatives for the proposed anesthesia with the patient or authorized representative who has indicated his/her understanding and acceptance.   Dental advisory given  Plan Discussed with: CRNA  Anesthesia Plan Comments:         Anesthesia Quick Evaluation

## 2015-02-17 NOTE — Anesthesia Postprocedure Evaluation (Signed)
  Anesthesia Post-op Note  Patient: Krystal Ellis  Procedure(s) Performed: Procedure(s) (LRB): MICRO LUMBAR DECOMPRESSION L5 - S1 ON RIGHT POSSIBLE LEFT (Bilateral)  Patient Location: PACU  Anesthesia Type: General  Level of Consciousness: awake and alert   Airway and Oxygen Therapy: Patient Spontanous Breathing  Post-op Pain: mild  Post-op Assessment: Post-op Vital signs reviewed, Patient's Cardiovascular Status Stable, Respiratory Function Stable, Patent Airway and No signs of Nausea or vomiting  Last Vitals:  Filed Vitals:   02/17/15 1320  BP: 113/48  Pulse: 88  Temp: 36.5 C  Resp: 14    Post-op Vital Signs: stable   Complications: No apparent anesthesia complications

## 2015-02-17 NOTE — Discharge Instructions (Signed)
Walk As Tolerated utilizing back precautions.  No bending, twisting, or lifting.  No driving for 2 weeks.   °Aquacel dressing may remain in place until follow up. May shower with aquacel dressing in place. If the dressing peels off or becomes saturated, you may remove aquacel dressing and place gauze and tape dressing which should be kept clean and dry and changed daily. Do not remove steri-strips if they are present. °See Dr. Mairen Wallenstein in office in 10 to 14 days. Begin taking aspirin 81mg per day starting 4 days after your surgery if not allergic to aspirin or on another blood thinner. °Walk daily even outside. Use a cane or walker only if necessary. °Avoid sitting on soft sofas. ° °

## 2015-02-18 DIAGNOSIS — M5117 Intervertebral disc disorders with radiculopathy, lumbosacral region: Secondary | ICD-10-CM | POA: Diagnosis not present

## 2015-02-18 MED ORDER — ASPIRIN EC 81 MG PO TBEC: 81.0000 mg | DELAYED_RELEASE_TABLET | Freq: Every day | ORAL | Status: DC

## 2015-02-18 MED ORDER — KCL IN DEXTROSE-NACL 20-5-0.45 MEQ/L-%-% IV SOLN
INTRAVENOUS | Status: DC
Start: 2015-02-18 — End: 2015-02-19
  Filled 2015-02-18 (×2): qty 1000

## 2015-02-18 NOTE — Care Management Note (Signed)
Case Management Note  Patient Details  Name: Krystal Ellis MRN: [id number] Date of Birth: 10-30-1960  Subjective/Objective:                  MICRO LUMBAR DECOMPRESSION L5 - S1 ON RIGHT POSSIBLE LEFT (Bilateral) Action/Plan: Discharge planning  Expected Discharge Date:  02/18/15               Expected Discharge Plan:  Home/Self Care  In-House Referral:     Discharge planning Services  CM Consult  Post Acute Care Choice:    Choice offered to:     DME Arranged:    DME Agency:     HH Arranged:    [place] Agency:     Status of Service:  Completed, signed off  Medicare Important Message Given:    Date Medicare IM Given:    Medicare IM give by:    Date Additional Medicare IM Given:    Additional Medicare Important Message give by:     If discussed at [place] of Stay Meetings, dates discussed:    Additional Comments: CM notes no HH services recc or ordered.  No other CM needs were communicated. Dellie Catholic, RN 02/18/2015, 1:44 PM

## 2015-02-18 NOTE — Op Note (Signed)
Krystal Krystal Ellis, Krystal Ellis NO.:  [id number]  MEDICAL RECORD NO.:  [id number]  LOCATION:  [id number]                         FACILITY:  [facility]  PHYSICIAN:  Susa Day, M.D.    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  POSTOPERATIVE DIAGNOSES:  Spinal stenosis, herniated nucleus pulposus at L5-S1 bilaterally.  PROCEDURES PERFORMED:  Microlumbar decompression at L5-S1 bilaterally through separate fascial incisions; hemilaminotomies and foraminotomies, L5-S1, bilaterally; microdiskectomy, L5-S1, left.  ANESTHESIA:  General.  ASSISTANT:  Krystal Krystal Ellis, P.A.  HISTORY:  This is a 54 year old woman with severe left lower extremity radicular pain secondary to disk herniation and lateral recess stenosis at L5-1.  She had predominant symptoms on the left, neural tension signs, EHL, and plantar flexion weakness as well as preoperatively, a straight leg raise positive on the right for S1 radicular pain.  She had an MRI indicating disk herniation and lateral recess stenosis bilaterally, left greater than right and neural foraminal stenosis, moderately severe on the left as opposed to the right.  She had a grade 1 listhesis at L4-5 and evidence of neural compression and a mild scoliosis.  She had minimal back pain and predominant leg pain.  It was felt that decompression L5-S1 bilaterally, preserving the facets, and decompressing the S1 nerve root would relieve her radicular symptoms. We did discuss risks and benefits including bleeding, infection, damage to neurovascular structures, DVT, PE, anesthetic complications, no change in symptoms, worsening symptoms, recurrent disk herniation, need for fusion in the future, etc.  TECHNIQUE:  With the patient in supine position, after induction of adequate  general anesthesia, 2 g Kefzol, she was placed prone on the [place] frame.  All bony prominences well padded.  Lumbar region was prepped and draped in usual sterile fashion.  Two 18-gauge spinal needles were utilized to localize L5-S1 interspace, confirmed with x- ray.  Incision was made from spinous process of L5-S1.  Subcutaneous tissues were dissected.  Electrocautery was utilized to achieve hemostasis.  Dorsolumbar fascia was identified and divided on either line of the interspinous ligament.  Paraspinous muscle elevated from lamina of L5-S1.  McCullough retractor was placed.  Operating microscope was draped and brought on the surgical field, confirming the disk space at L5-S1.  Attention turned first towards the left ligamentum flavum, detached from the cephalad edge of S1 utilizing a straight microcurette. Neural patty placed beneath the ligamentum flavum.  I removed ligamentum from the interspace.  Severe compression of the S1 nerve root immediately noted beneath into the lateral recess, multifactorial.  I performed a foraminotomy of S1.  There was epidural lipomatosis noted here as well.  The S1 nerve root was identified into the lateral recess. Protecting the nerve root with a Penfield, we used a 2-mm Kerrison, decompressed the lateral recess to the medial border of the pedicle. Following that, we were able to gently mobilize the S1 nerve root medially.  Herniated disc and the fragment were noted at L5-S1, extending both then into the foramen as well as medially.  This  was retrieved with a micropituitary and sent to Pathology.  We entered the disk space and removed herniated disk material.  There was hypertrophic facet ligamentum flavum buckling and hypertrophy extending into the neural foramen of 5.  The disk extended into the foramen of L5 as well. The disk was removed.  Performed a foraminotomy, removing the ligamentum flavum, protecting the L5 root.  Removed a small portion of  the superior articulating process of S1.  A neural probe passed freely at the foramen of L5 following the decompression and removal of the disk herniation. This was consistent with that seen on her MRI and her physical exam. Copiously irrigated the disk space with antibiotic irrigation.  No evidence of CSF leakage or active bleeding.  We draped epidural fat over the S1 nerve root.  There was 1 cm of excursion of the S1 nerve root medial to pedicle without tension.  Next, we turned our attention to the right side and in a similar fashion, we had a very small interlaminar window.  We detached ligamentum flavum from the cephalad edge of S1 and then protecting neural elements, performed a foraminotomy of S1. Decompressed lateral recess to the medial border of the pedicle, again the superior articulating process and infolding ligamentum was found to create stenosis of the lateral recess and into the foramen of L5.  The neural elements protected.  Decompressed again lateral recess in the medial border of the pedicle, performed a foraminotomy of L5. Preserving the pars as we did on the contralateral side.  No disk herniation noted here.  This freed the S1 nerve root as well as the L5 root.  Neural probe passed freely up the neural foramen.  Following the decompression, no disk herniation.  Here confirmatory radiograph obtained with a Penfield at the disk space and a probe in the foramen of L5 and S1.  Copiously irrigated the wound.  No evidence of CSF leakage or active bleeding.  We removed all instrumentation and the [facility]. No active bleeding was noted.  Dorsolumbar fascia after copious irrigation was repair with #1 Vicryl interrupted figure-of-eight sutures, subcu with 2-0, and skin with subcuticular Prolene.  Sterile dressing applied.  Placed supine on the hospital bed, extubated without difficulty, and transported to the recovery room in satisfactory condition.  The patient tolerated  the procedure well.  There were no complications. Assistant, Krystal Dunker, PA, was used for gentle intermittent neural traction, the patient positioning, and closure.     Susa Day, M.D.     Krystal Rile  D:  02/17/2015  T:  02/18/2015  Job:  [id number]

## 2015-02-18 NOTE — Evaluation (Signed)
Occupational Therapy Evaluation Patient Details Name: Krystal Ellis MRN: [id number] DOB: Apr 09, 1961 Today's Date: 02/18/2015    History of Present Illness Microlumbar decompression at L5-S1 bilaterally, foramininotomies and hemilaminectomies, bilaterally;, L5-S1 microdiscectomy, Left.   Clinical Impression   Patient presenting with decreased ADL and functional mobility independence secondary to above. Patient independent PTA. Patient currently functioning at an overall min to mod assist level. Patient will benefit from acute OT to increase overall independence in the areas of ADLs, functional mobility, and overall safety in order to safely discharge home with assistance from her father and her son. Pt reports she plans to go to her father's house where he will be there all the time and her 92 yo son will help intermittently.     Follow Up Recommendations  No OT follow up;Supervision/Assistance - 24 hour    Equipment Recommendations  Other (comment) (AE - reacher, sock aid, LH sponge, LH shoe horn)    Recommendations for Other Services  None at this time    Precautions / Restrictions Precautions Precautions: Back Precaution Comments: reviewed back precautions with patient Required Braces or Orthoses:  (no back brace required per order) Restrictions Weight Bearing Restrictions: No    Mobility Bed Mobility Overal bed mobility: Needs Assistance Bed Mobility: Rolling;Sidelying to Sit Rolling: Supervision Sidelying to sit: Supervision       General bed mobility comments: Moderate use of bed rails. Cues for technique and back precautions. Supervision for safety.   Transfers Overall transfer level: Needs assistance Equipment used: Rolling walker (2 wheeled) Transfers: Sit to/from Stand Sit to Stand: Mod assist;Min assist General transfer comment: Mod assist to stand from EOB. Min assist to stand from comfort height toilet. Cues for technique and hand placement using RW.      Balance Overall balance assessment: Needs assistance Sitting-balance support: No upper extremity supported;Feet supported Sitting balance-Leahy Scale: Good     Standing balance support: Bilateral upper extremity supported;During functional activity Standing balance-Leahy Scale: Fair    ADL Overall ADL's : Needs assistance/impaired Eating/Feeding: Set up;Sitting   Grooming: Set up;Sitting   Upper Body Bathing: Minimal assitance;Sitting   Lower Body Bathing: Sit to/from stand;Moderate assistance   Upper Body Dressing : Minimal assistance;Sitting   Lower Body Dressing: Moderate assistance;Sit to/from stand   Toilet Transfer: Minimal assistance;RW;Comfort height toilet;Grab bars   Toileting- Clothing Manipulation and Hygiene: Sit to/from stand;Moderate assistance       Functional mobility during ADLs: Minimal assistance;Cueing for safety;Rolling walker General ADL Comments: Pt found supine in bed. Pt engaged in bed mobility with close supervision and cues for back precautions. Pt sat EOB and unable to cross BLEs for LB ADLs. Pt ambulated into BR for toilet transfer and toileting needs. Pt stood at sink to wash hands and started feeling lightheaded and weak. +2 assist to grab recliner and patient sat in recliner due to weakness and fatigue.  Plan to go over hip kit/AE next OT session.    Pertinent Vitals/Pain Pain Assessment: 0-10 Pain Score: 10-Worst pain ever Pain Location: back Pain Descriptors / Indicators: Aching;Discomfort;Guarding Pain Intervention(s): Limited activity within patient's tolerance;Monitored during session;Repositioned     Hand Dominance Right   Extremity/Trunk Assessment Upper Extremity Assessment Upper Extremity Assessment: Overall WFL for tasks assessed   Lower Extremity Assessment Lower Extremity Assessment: Defer to PT evaluation   Cervical / Trunk Assessment Cervical / Trunk Assessment: Normal   Communication Communication Communication: No  difficulties   Cognition Arousal/Alertness: Awake/alert Behavior During Therapy: WFL for tasks assessed/performed Overall Cognitive Status:  Within Functional Limits for tasks assessed              Home Living Family/patient expects to be discharged to:: Private residence Living Arrangements: Parent Available Help at Discharge: Family Type of Home: House Home Access: Stairs to enter Technical brewer of Steps: 3 Entrance Stairs-Rails: Right;Left;Can reach both [place]: One level     Bathroom Shower/Tub: Walk-in Hydrologist: [place];Shower seat;Bedside commode;Walker - 2 wheels   Additional Comments: Pt reports she is going to go home with her father for a little while      Prior Functioning/Environment Level of Independence: Independent     OT Diagnosis: Generalized weakness   OT Problem List: Decreased strength;Decreased activity tolerance;Impaired balance (sitting and/or standing);Decreased safety awareness;Decreased knowledge of use of DME or AE;Pain   OT Treatment/Interventions: Self-care/ADL training;Energy conservation;DME and/or AE instruction;Therapeutic activities;Patient/family education;Balance training    OT Goals(Current goals can be found in the care plan section) Acute Rehab OT Goals Patient Stated Goal: decrease pain OT Goal Formulation: With patient Time For Goal Achievement: 03/04/15 Potential to Achieve Goals: Good ADL Goals Pt Will Perform Grooming: with modified independence;standing Pt Will Perform Lower Body Bathing: with modified independence;sit to/from stand;with adaptive equipment Pt Will Perform Lower Body Dressing: with modified independence;sit to/from stand;with adaptive equipment Pt Will Transfer to Toilet: with modified independence;ambulating;bedside commode Pt Will Perform Tub/Shower Transfer: Shower transfer;ambulating;rolling walker;shower seat;with modified  independence Additional ADL Goal #1: Pt will be mod I with functional mobility using RW Additional ADL Goal #2: Pt will be independent with verbalizing and adhering to back precautions during ADLs and functionall mobility 100% of the time  OT Frequency: Min 2X/week   Barriers to D/C: None known at this time   End of Session Equipment Utilized During Treatment: Gait belt;Rolling walker Nurse Communication: Mobility status  Activity Tolerance: Patient limited by fatigue Patient left: in chair;with call bell/phone within reach;with family/visitor present   Time: 0174-9449 OT Time Calculation (min): 28 min Charges:  OT General Charges $OT Visit: 1 Procedure OT Evaluation $Initial OT Evaluation Tier I: 1 Procedure OT Treatments $Self Care/Home Management : 8-22 mins G-Codes: OT G-codes **NOT FOR INPATIENT CLASS** Functional Limitation: Self care Self Care Current Status (Q7591): At least 20 percent but less than 40 percent impaired, limited or restricted Self Care Goal Status (M3846): At least 1 percent but less than 20 percent impaired, limited or restricted  Tannisha Kennington , MS, OTR/L, CLT Pager: [phone]  02/18/2015, 11:30 AM

## 2015-02-18 NOTE — Progress Notes (Signed)
Subjective: 1 Day Post-Op Procedure(s) (LRB): MICRO LUMBAR DECOMPRESSION L5 - S1 ON RIGHT POSSIBLE LEFT (Bilateral) Patient reports pain as mild.   Seen by Dr. Tonita Cong in AM rounds. Leg pain improved. No other c/o.  Objective: Vital signs in last 24 hours: Temp:  [97.6 F (36.4 C)-98.7 F (37.1 C)] 98.7 F (37.1 C) (11/10 0545) Pulse Rate:  [76-101] 80 (11/10 0545) Resp:  [10-18] 16 (11/10 0545) BP: (96-117)/(43-61) 117/43 mmHg (11/10 0545) SpO2:  [96 %-100 %] 98 % (11/10 0545) Weight:  [81.647 kg (180 lb)] 81.647 kg (180 lb) (11/09 1320)  Intake/Output from previous day: 11/09 0701 - 11/10 0700 In: 5311.7 [P.O.:1140; I.V.:3171.7; IV Piggyback:100] Out: 3075 [Urine:2975; Blood:100] Intake/Output this shift: Total I/O In: 0  Out: 150 [Urine:150]  No results for input(s): HGB in the last 72 hours. No results for input(s): WBC, RBC, HCT, PLT in the last 72 hours. No results for input(s): NA, K, CL, CO2, BUN, CREATININE, GLUCOSE, CALCIUM in the last 72 hours. No results for input(s): LABPT, INR in the last 72 hours.  Neurologically intact ABD soft Neurovascular intact Sensation intact distally Intact pulses distally Dorsiflexion/Plantar flexion intact Incision: dressing C/D/I and no drainage No cellulitis present Compartment soft no sign of DVT  Assessment/Plan: 1 Day Post-Op Procedure(s) (LRB): MICRO LUMBAR DECOMPRESSION L5 - S1 ON RIGHT POSSIBLE LEFT (Bilateral) Advance diet Up with therapy D/C IV fluids  Lspine precautions Possible D/c later today depending on progress with PT and pain control  BISSELL, JACLYN M. 02/18/2015, 10:01 AM

## 2015-02-18 NOTE — Evaluation (Signed)
Physical Therapy Evaluation Patient Details Name: Krystal Ellis MRN: [id number] DOB: January 25, 1961 Today's Date: 02/18/2015   History of Present Illness  Microlumbar decompression at L5-S1 bilaterally, foramininotomies and hemilaminectomies, bilaterally;, L5-S1 microdiscectomy, Left.  Clinical Impression  Pt admitted with above diagnosis. Pt currently with functional limitations due to the deficits listed below (see PT Problem List). Pt will benefit from skilled PT to increase their independence and safety with mobility to allow discharge to the venue listed below.  Patient is feeling better and ambulated  X 50/ moving slowly. Very sleepy after meds.      Follow Up Recommendations No PT follow up    Equipment Recommendations  None recommended by PT    Recommendations for Other Services       Precautions / Restrictions Precautions Precautions: Back Precaution Booklet Issued: Yes (comment) Precaution Comments: reviewed back precautions with patient Required Braces or Orthoses:  (no back brace required per order) Restrictions Weight Bearing Restrictions: No      Mobility  Bed Mobility Overal bed mobility: Needs Assistance Bed Mobility: Sit to Supine Rolling: Supervision Sidelying to sit: Supervision   Sit to supine: Supervision   General bed mobility comments: cues for technique, precautions, how to use the RW beside the bed as a rail.  Transfers Overall transfer level: Needs assistance Equipment used: Rolling walker (2 wheeled) Transfers: Sit to/from Stand Sit to Stand: Supervision         General transfer comment: cues for hand placement  Ambulation/Gait Ambulation/Gait assistance: Min guard Ambulation Distance (Feet): 50 Feet Assistive device: Rolling walker (2 wheeled) Gait Pattern/deviations: Step-to pattern     General Gait Details: cues for posture.  Stairs            Wheelchair Mobility    Modified Rankin (Stroke Patients Only)        Balance Overall balance assessment: Needs assistance Sitting-balance support: No upper extremity supported;Feet supported Sitting balance-Leahy Scale: Good     Standing balance support: Bilateral upper extremity supported;During functional activity Standing balance-Leahy Scale: Fair                               Pertinent Vitals/Pain Pain Assessment: 0-10 Pain Score: 6  Pain Location: back Pain Descriptors / Indicators: Aching;Discomfort;Grimacing Pain Intervention(s): Limited activity within patient's tolerance;Monitored during session;Premedicated before session;Repositioned;Ice applied    Home Living Family/patient expects to be discharged to:: Private residence Living Arrangements: Parent Available Help at Discharge: Family Type of Home: House Home Access: Stairs to enter Entrance Stairs-Rails: Right;Left;Can reach both Technical brewer of Steps: 3 Home Layout: One level Home Equipment: Grab bars - tub/shower;Shower seat;Bedside commode;Walker - 2 wheels Additional Comments: Pt reports she is going to go home with her father for a little while    Prior Function Level of Independence: Independent               Hand Dominance   Dominant Hand: Right    Extremity/Trunk Assessment   Upper Extremity Assessment: Defer to OT evaluation           Lower Extremity Assessment: Overall WFL for tasks assessed;RLE deficits/detail RLE Deficits / Details: R little toe    Cervical / Trunk Assessment: Normal  Communication   Communication: No difficulties  Cognition Arousal/Alertness: Awake/alert Behavior During Therapy: WFL for tasks assessed/performed Overall Cognitive Status: Within Functional Limits for tasks assessed  General Comments      Exercises        Assessment/Plan    PT Assessment Patient needs continued PT services  PT Diagnosis Difficulty walking;Acute pain   PT Problem List Decreased  activity tolerance;Decreased mobility;Decreased safety awareness;Decreased knowledge of precautions;Pain  PT Treatment Interventions DME instruction;Gait training;Stair training;Functional mobility training;Patient/family education   PT Goals (Current goals can be found in the Care Plan section) Acute Rehab PT Goals Patient Stated Goal: decrease pain, walk PT Goal Formulation: With patient/family Time For Goal Achievement: 02/20/15 Potential to Achieve Goals: Good    Frequency Min 5X/week   Barriers to discharge        Co-evaluation               End of Session   Activity Tolerance: Patient tolerated treatment well Patient left: in bed;with call bell/phone within reach;with family/visitor present Nurse Communication: Mobility status    Functional Assessment Tool Used: clinical judghement Functional Limitation: Mobility: Walking and moving around Mobility: Walking and Moving Around Current Status [id number]): At least 20 percent but less than 40 percent impaired, limited or restricted Mobility: Walking and Moving Around Goal Status [phone]): At least 1 percent but less than 20 percent impaired, limited or restricted    Time: 1211-1227 PT Time Calculation (min) (ACUTE ONLY): 16 min   Charges:   PT Evaluation $Initial PT Evaluation Tier I: 1 Procedure     PT G Codes:   PT G-Codes **NOT FOR INPATIENT CLASS** Functional Assessment Tool Used: clinical judghement Functional Limitation: Mobility: Walking and moving around Mobility: Walking and Moving Around Current Status [id number]): At least 20 percent but less than 40 percent impaired, limited or restricted Mobility: Walking and Moving Around Goal Status [phone]): At least 1 percent but less than 20 percent impaired, limited or restricted    Krystal Ellis 02/18/2015, 1:19 PM Krystal Ellis PT [phone]

## 2015-02-19 DIAGNOSIS — M5117 Intervertebral disc disorders with radiculopathy, lumbosacral region: Secondary | ICD-10-CM | POA: Diagnosis not present

## 2015-02-19 MED ORDER — ASPIRIN EC 81 MG PO TBEC: 81.0000 mg | DELAYED_RELEASE_TABLET | Freq: Every day | ORAL | Status: DC

## 2015-02-19 NOTE — Progress Notes (Signed)
Occupational Therapy Treatment Patient Details Name: Krystal Ellis MRN: [id number] DOB: 26-Feb-1961 Today's Date: 02/19/2015    History of present illness Microlumbar decompression at L5-S1 bilaterally, foramininotomies and hemilaminectomies, bilaterally;, L5-S1 microdiscectomy, Left.   OT comments  Patient progressing nicely towards OT goals, continue plan of care for now. Pt able to tolerate therapy session better today and functioning at an overall supervision to mod I level. No need for AE, pt able to cross feet today in sitting position for LB ADLs.    Follow Up Recommendations  No OT follow up;Supervision - Intermittent    Equipment Recommendations  None recommended by OT    Recommendations for Other Services  None at this time   Precautions / Restrictions Precautions Precautions: Back Precaution Comments: Patient able to verbalize and demonstrate adherence to 3/3 back precautions Required Braces or Orthoses:  (no back brace required per order) Restrictions Weight Bearing Restrictions: No     Mobility Bed Mobility Overal bed mobility: Modified Independent General bed mobility comments: No cueing or physical assistance needed  Transfers Overall transfer level: Needs assistance Equipment used: Rolling walker (2 wheeled) Transfers: Sit to/from Stand Sit to Stand: Supervision General transfer comment: cues for hand placement    Balance Overall balance assessment: Needs assistance Sitting-balance support: No upper extremity supported;Feet supported Sitting balance-Leahy Scale: Good     Standing balance support: Bilateral upper extremity supported;During functional activity Standing balance-Leahy Scale: Good   ADL Overall ADL's : Needs assistance/impaired Eating/Feeding: Set up;Sitting   Grooming: Supervision/safety;Standing   Upper Body Bathing: Set up;Sitting   Lower Body Bathing: Supervison/ safety;Sit to/from stand   Upper Body Dressing : Set up;Sitting    Lower Body Dressing: Supervision/safety;Sit to/from stand   Toilet Transfer: Supervision/safety;RW;Comfort height toilet;Grab bars   Toileting- Clothing Manipulation and Hygiene: Supervision/safety;Sit to/from stand   Tub/ Shower Transfer: Supervision/safety;Walk-in shower;Rolling walker;Grab bars;Shower seat   Functional mobility during ADLs: Supervision/safety;Rolling walker General ADL Comments: Pt stated she's feeling much better today. Pt engaged in bed mobility at mod I level, no cueing needed for back precautions. Pt sat EOB to don bilateral socks by crossing legs. No need for AE. Pt stood with RW and min vcs. Pt ambulated into BR for toilet transfer, grooming task of washing hands at sink, and simulated walk-in shower transfer. Pt overall supervision to mod I for tasks.      Cognition   Behavior During Therapy: WFL for tasks assessed/performed Overall Cognitive Status: Within Functional Limits for tasks assessed                Pertinent Vitals/ Pain       Pain Assessment: 0-10 Pain Score: 3  Pain Location: back Pain Descriptors / Indicators: Discomfort;Sore Pain Intervention(s): Monitored during session;Repositioned   Frequency Min 2X/week     Progress Toward Goals  OT Goals(current goals can now befound in the care plan section)  Progress towards OT goals: Progressing toward goals     Plan Discharge plan remains appropriate;Equipment recommendations need to be updated    End of Session Equipment Utilized During Treatment: Gait belt;Rolling walker   Activity Tolerance Patient tolerated treatment well   Patient Left in chair;with call bell/phone within reach;with family/visitor present    Time: [id number] OT Time Calculation (min): 14 min  Charges: OT General Charges $OT Visit: 1 Procedure OT Treatments $Self Care/Home Management : 8-22 mins  Anush Wiedeman , MS, OTR/L, CLT Pager: [phone]  02/19/2015, 9:11 AM

## 2015-02-19 NOTE — Discharge Summary (Signed)
Physician Discharge Summary   Patient ID: Krystal Ellis MRN: [id number] DOB/AGE: May 29, 1960 54 y.o.  Admit date: 02/17/2015 Discharge date: 02/19/2015  Primary Diagnosis:   HNP, STENOSIS L5 - S1  Admission Diagnoses:  Past Medical History  Diagnosis Date  . Family history of adverse reaction to anesthesia     mother who is deceased had hx of N/V   . Hypertension   . Anxiety   . Kidney stones   . Headache     hx of occasional migraine   . Arthritis     both index fingers   . Cancer ([place])     hx of precancerous lesions on back - removed    Discharge Diagnoses:   Principal Problem:   Spinal stenosis of lumbar region  Procedure:  Procedure(s) (LRB): MICRO LUMBAR DECOMPRESSION L5 - S1 ON RIGHT POSSIBLE LEFT (Bilateral)   Consults: None  HPI:  no sign of DVT    Laboratory Data: Hospital Outpatient Visit on 02/12/2015  Component Date Value Ref Range Status  . Sodium 02/12/2015 137  135 - 145 mmol/L Final  . Potassium 02/12/2015 4.0  3.5 - 5.1 mmol/L Final  . Chloride 02/12/2015 103  101 - 111 mmol/L Final  . CO2 02/12/2015 26  22 - 32 mmol/L Final  . Glucose, Bld 02/12/2015 98  65 - 99 mg/dL Final  . BUN 02/12/2015 15  6 - 20 mg/dL Final  . Creatinine, Ser 02/12/2015 0.57  0.44 - 1.00 mg/dL Final  . Calcium 02/12/2015 9.6  8.9 - 10.3 mg/dL Final  . GFR calc non Af Amer 02/12/2015 >60  >60 mL/min Final  . GFR calc Af Amer 02/12/2015 >60  >60 mL/min Final   Comment: (NOTE) The eGFR has been calculated using the CKD EPI equation. This calculation has not been validated in all clinical situations. eGFR's persistently <60 mL/min signify possible Chronic Kidney Disease.   . Anion gap 02/12/2015 8  5 - 15 Final  . WBC 02/12/2015 8.6  4.0 - 10.5 K/uL Final  . RBC 02/12/2015 4.25  3.87 - 5.11 MIL/uL Final  . Hemoglobin 02/12/2015 13.3  12.0 - 15.0 g/dL Final  . HCT 02/12/2015 39.4  36.0 - 46.0 % Final  . MCV 02/12/2015 92.7  78.0 - 100.0 fL Final  . MCH 02/12/2015  31.3  26.0 - 34.0 pg Final  . MCHC 02/12/2015 33.8  30.0 - 36.0 g/dL Final  . RDW 02/12/2015 12.6  11.5 - 15.5 % Final  . Platelets 02/12/2015 304  150 - 400 K/uL Final  . MRSA, PCR 02/12/2015 NEGATIVE  NEGATIVE Final  . Staphylococcus aureus 02/12/2015 NEGATIVE  NEGATIVE Final   Comment:        The Xpert SA Assay (FDA approved for NASAL specimens in patients over 36 years of age), is one component of a comprehensive surveillance program.  Test performance has been validated by [facility] for patients greater than or equal to 76 year old. It is not intended to diagnose infection nor to guide or monitor treatment.   . Preg, Serum 02/12/2015 NEGATIVE  NEGATIVE Final   Comment:        THE SENSITIVITY OF THIS METHODOLOGY IS >10 mIU/mL.    No results for input(s): HGB in the last 72 hours. No results for input(s): WBC, RBC, HCT, PLT in the last 72 hours. No results for input(s): NA, K, CL, CO2, BUN, CREATININE, GLUCOSE, CALCIUM in the last 72 hours. No results for input(s): LABPT, INR in the last  72 hours.  X-Rays:Dg Lumbar Spine 2-3 Views  02/12/2015  CLINICAL DATA:  Status post fall 2 years ago striking the buttock region, patient reports pain radiating down the right leg EXAM: LUMBAR SPINE - 2-3 VIEW COMPARISON:  There no previous lumbar spine x-rays or chest x-rays for review. For purposes of this study there are assumed to be 5 non-rib-bearing lumbar type vertebral bodies present. FINDINGS: The lumbar vertebral bodies are preserved in height. There is mild disc space narrowing at L4-5 and L5-S1. There is minimal grade 1 anterolisthesis of L4 with respect L5 that is likely secondary to degenerative disc and facet joint change. There is facet joint hypertrophy at these levels bilaterally. No pars defects are observed. The pedicles and transverse processes are intact. There small anterior and lateral endplate osteophytes at multiple levels. The observed portions of the sacrum are normal.  IMPRESSION: Mild degenerative disc and facet joint change at L4-5 and at L5-S1. There is no compression fracture. There is mild grade 1 anterolisthesis of L4 with respect L5. Given the patient's radicular symptoms, lumbar spine MRI may be useful. Electronically Signed   By: David  Martinique M.D.   On: 02/12/2015 11:42   Dg Spine Portable 1 View  02/17/2015  CLINICAL DATA:  Lumbar surgery, L5-S1. EXAM: PORTABLE SPINE - 1 VIEW COMPARISON:  02/17/2015 at 9:08. FINDINGS: Single lateral intraoperative view of lumbar spine demonstrates a blunt tip probe at the L5-S1 disc level. Adjacent probes at L5 and S1. IMPRESSION: Intraoperative view as above. Electronically Signed   By: Suzy Bouchard M.D.   On: 02/17/2015 10:33   Dg Spine Portable 1 View  02/17/2015  CLINICAL DATA:  Intraoperative localization. Please number spinous processes EXAM: PORTABLE SPINE - 1 VIEW COMPARISON:  02/17/2015 FINDINGS: Mild anterior slip L4-5.  Facet degeneration L4-5 and L5-S1 Tissue spreaders at L5-S1. Surgical instruments located near the lamina of L5. IMPRESSION: L5-S1 localization as above. Electronically Signed   By: Franchot Gallo M.D.   On: 02/17/2015 09:25   Dg Spine Portable 1 View  02/17/2015  CLINICAL DATA:  Surgical level L5-S1 EXAM: PORTABLE SPINE - 1 VIEW COMPARISON:  02/12/2015 lumbar spine radiograph FINDINGS: Portable lateral intraoperative lumbar spine radiograph demonstrates the tip of a posterior approach surgical marking device overlying the posterior tip of the L5 spinous process. Stable 7 mm anterolisthesis at L4-5 with loss of disc height at L4-5 and L5-S1. IMPRESSION: Posterior approach surgical marking device terminates over the posterior tip of the L5 spinous process. Electronically Signed   By: Ilona Sorrel M.D.   On: 02/17/2015 09:12    EKG: Orders placed or performed during the hospital encounter of 02/12/15  . EKG 12-Lead  . EKG 12-Lead     Hospital Course: Patient was admitted to [facility] and taken to the OR and underwent the above state procedure without complications.  Patient tolerated the procedure well and was later transferred to the recovery room and then to the orthopaedic floor for postoperative care.  They were given PO and IV analgesics for pain control following their surgery.  They were given 24 hours of postoperative antibiotics.   PT was consulted postop to assist with mobility and transfers.  The patient was allowed to be WBAT with therapy and was taught back precautions. Discharge planning was consulted to help with postop disposition and equipment needs.  Patient had a fair night on the evening of surgery and started to get up OOB with therapy on day one. Patient was  seen in rounds and was ready to go home on day one.  They were given discharge instructions and dressing directions.  They were instructed on when to follow up in the office with Dr. Tonita Cong.   Diet: Regular diet Activity:WBAT Follow-up:in 10-14 days Disposition - Home Discharged Condition: good   Discharge Instructions    Call MD / Call 911    Complete by:  As directed   If you experience chest pain or shortness of breath, CALL 911 and be transported to the hospital emergency room.  If you develope a fever above 101 F, pus (white drainage) or increased drainage or redness at the wound, or calf pain, call your surgeon's office.     Constipation Prevention    Complete by:  As directed   Drink plenty of fluids.  Prune juice may be helpful.  You may use a stool softener, such as Colace (over the counter) 100 mg twice a day.  Use MiraLax (over the counter) for constipation as needed.     Diet - low sodium heart healthy    Complete by:  As directed      Discharge instructions    Complete by:  As directed   During your recent anesthetic, you were given the medication sugammadex ([place]). This medication interacts with hormonal forms of birth control (oral contraceptives and injected or implanted  birth control) and may make them ineffective. IFYOU USE ANY HORMONAL FORM OF BIRTH CONTROL, YOU MUST USE AN ADDITIONAL BARRIER BIRTH CONTROL FOR METHOD FOR SEVEN DAYS after receiving sugammadex ([place]ridion) or there is a chance you could become pregnant.     Increase activity slowly as tolerated    Complete by:  As directed             Medication List    TAKE these medications        aspirin EC 81 MG tablet  Take 1 tablet (81 mg total) by mouth daily. Resume 4 days post-op     atorvastatin 10 MG tablet  Commonly known as:  LIPITOR  Take 10 mg by mouth daily.     CHANTIX STARTING MONTH PAK 0.5 MG X 11 & 1 MG X 42 tablet  Generic drug:  varenicline  Take 1 mg by mouth 2 (two) times daily.     docusate sodium 100 MG capsule  Commonly known as:  COLACE  Take 1 capsule (100 mg total) by mouth 2 (two) times daily as needed for mild constipation.     FLUoxetine 10 MG capsule  Commonly known as:  PROZAC  Take 10 mg by mouth daily.     gabapentin 300 MG capsule  Commonly known as:  NEURONTIN  Take 300 mg by mouth 3 (three) times daily.     LORZONE 750 MG Tabs  Generic drug:  Chlorzoxazone  Take 375-750 mg by mouth 4 (four) times daily.     methocarbamol 500 MG tablet  Commonly known as:  ROBAXIN  Take 1 tablet (500 mg total) by mouth every 8 (eight) hours as needed for muscle spasms.     oxyCODONE-acetaminophen 5-325 MG tablet  Commonly known as:  PERCOCET  Take 1-2 tablets by mouth every 4 (four) hours as needed.     TRIBENZOR 40-5-25 MG Tabs  Generic drug:  Olmesartan-Amlodipine-HCTZ  Take 1 tablet by mouth daily.           Follow-up Information    Follow up with BEANE,JEFFREY C, MD In 2 weeks.   Specialty:  Orthopedic Surgery   Why:  For suture removal   Contact information:   [address] [place] [address] [phone]       Signed: Lacie Draft, PA-C Orthopaedic Surgery 02/19/2015, 8:14 AM

## 2015-02-19 NOTE — Progress Notes (Signed)
Physical Therapy Treatment Patient Details Name: Hortensia Sanmiguel MRN: [id number] DOB: Jan 10, 1961 Today's Date: 02/19/2015    History of Present Illness Microlumbar decompression at L5-S1 bilaterally, foramininotomies and hemilaminectomies, bilaterally;, L5-S1 microdiscectomy, Left.    PT Comments    Pt. Progressing well with mobility and awareness of safety/precautions; ambulated from recliner to bathroom and 50' in hallway, trained on 2 steps with R and L rails to simulate home environment - requiring supervision for safety; pt. 90% independent with precautions/not pushing through UE; returned patient to bed, instructed on sit > sidelying requiring min assist for keeping alignment; educated patient on do's and don'ts at home, reviewed precautions, and educated on changing positions throughout day for comfort.   Follow Up Recommendations  No PT follow up     Equipment Recommendations  None recommended by PT    Recommendations for Other Services       Precautions / Restrictions Precautions Precautions: Back Precaution Booklet Issued: Yes (comment) Precaution Comments: Patient able to verbalize and demonstrate adherence to 3/3 back precautions Required Braces or Orthoses:  (no back brace required per order) Restrictions Weight Bearing Restrictions: No    Mobility  Bed Mobility Overal bed mobility: Modified Independent;Needs Assistance Bed Mobility: Sit to Sidelying         Sit to sidelying: Min assist General bed mobility comments: min assist to keep alignment during sit > sidelying; VC to keep shoulders and knees pointing forward   Transfers Overall transfer level: Modified independent Equipment used: Rolling walker (2 wheeled) Transfers: Sit to/from Stand Sit to Stand: Supervision         General transfer comment: cues for hand placement  Ambulation/Gait Ambulation/Gait assistance: Supervision Ambulation Distance (Feet): 50 Feet Assistive device: Rolling walker  (2 wheeled) Gait Pattern/deviations: WFL(Within Functional Limits);Step-to pattern;Step-through pattern Gait velocity: decreased    General Gait Details: pt. presented with upright posture, inside walker and non-deviated gait pattern; decreased velocity    Stairs Stairs: Yes Stairs assistance: Supervision Stair Management: Two rails;Forwards Number of Stairs: 2 General stair comments: pt. supervision for safety with stairs; adheres to not pushing through UE and aware of precautions   Wheelchair Mobility    Modified Rankin (Stroke Patients Only)       Balance Overall balance assessment: Needs assistance Sitting-balance support: No upper extremity supported;Feet supported Sitting balance-Leahy Scale: Good     Standing balance support: Bilateral upper extremity supported;During functional activity Standing balance-Leahy Scale: Good                      Cognition Arousal/Alertness: Awake/alert Behavior During Therapy: WFL for tasks assessed/performed Overall Cognitive Status: Within Functional Limits for tasks assessed                      Exercises      General Comments        Pertinent Vitals/Pain Pain Assessment: 0-10 Pain Score: 4  Pain Location: lumbar  Pain Descriptors / Indicators: Discomfort;Sore Pain Intervention(s): Limited activity within patient's tolerance;Monitored during session;Repositioned;Ice applied    Home Living                      Prior Function            PT Goals (current goals can now be found in the care plan section) Acute Rehab PT Goals Time For Goal Achievement: 02/20/15 Potential to Achieve Goals: Good Progress towards PT goals: Progressing toward goals    Frequency  Min  5X/week    PT Plan      Co-evaluation             End of Session Equipment Utilized During Treatment: Gait belt Activity Tolerance: Patient tolerated treatment well Patient left: in bed;with call bell/phone within  reach;with bed alarm set     Time: [id number] PT Time Calculation (min) (ACUTE ONLY): 30 min  Charges:  $Gait Training: 8-22 mins $Therapeutic Activity: 8-22 mins                    G CodesDenna Haggard, SPTA   02/19/2015 12:56 PM   Pager: [phone]   Reviewed and agree with above Rica Koyanagi  PTA [facility]  Acute  Rehab Pager      [phone]

## 2015-02-19 NOTE — Progress Notes (Signed)
Subjective: 2 Days Post-Op Procedure(s) (LRB): MICRO LUMBAR DECOMPRESSION L5 - S1 ON RIGHT POSSIBLE LEFT (Bilateral) Patient reports pain as mild.   Leg pain resolved. No numbness or tingling. Less back pain today. No N/V or other c/o. Feels ready for D/C home today.  Objective: Vital signs in last 24 hours: Temp:  [97.7 F (36.5 C)-99.4 F (37.4 C)] 97.7 F (36.5 C) (11/11 0300) Pulse Rate:  [76-88] 76 (11/11 0300) Resp:  [16-18] 16 (11/11 0300) BP: (98-122)/(35-47) 109/47 mmHg (11/11 0300) SpO2:  [96 %-98 %] 96 % (11/11 0300)  Intake/Output from previous day: 11/10 0701 - 11/11 0700 In: 1300 [P.O.:700; I.V.:550; IV Piggyback:50] Out: 1450 [Urine:1450] Intake/Output this shift:    No results for input(s): HGB in the last 72 hours. No results for input(s): WBC, RBC, HCT, PLT in the last 72 hours. No results for input(s): NA, K, CL, CO2, BUN, CREATININE, GLUCOSE, CALCIUM in the last 72 hours. No results for input(s): LABPT, INR in the last 72 hours.  Neurologically intact ABD soft Neurovascular intact Sensation intact distally Intact pulses distally Dorsiflexion/Plantar flexion intact Incision: dressing C/D/I and no drainage No cellulitis present Compartment soft no sign of DVT  Assessment/Plan: 2 Days Post-Op Procedure(s) (LRB): MICRO LUMBAR DECOMPRESSION L5 - S1 ON RIGHT POSSIBLE LEFT (Bilateral) Advance diet Up with therapy D/C IV fluids  Discussed D/C instructions, Lspine precautions, dressing instructions D/C home today  Follow up in office in 2 weeks for suture removal Will discuss with Dr. Mliss Fritz, [place]. 02/19/2015, 8:12 AM

## 2015-07-29 ENCOUNTER — Ambulatory Visit: Payer: Self-pay | Admitting: Orthopedic Surgery

## 2015-08-02 ENCOUNTER — Encounter (HOSPITAL_COMMUNITY)
Admission: RE | Admit: 2015-08-02 | Discharge: 2015-08-02 | Disposition: A | Discharge status: Home / Self Care | Payer: BC Managed Care – PPO | Source: Ambulatory Visit | Attending: Specialist | Admitting: Specialist

## 2015-08-02 ENCOUNTER — Encounter (HOSPITAL_COMMUNITY): Payer: Self-pay

## 2015-08-02 DIAGNOSIS — M19042 Primary osteoarthritis, left hand: Secondary | ICD-10-CM | POA: Diagnosis not present

## 2015-08-02 DIAGNOSIS — M19041 Primary osteoarthritis, right hand: Secondary | ICD-10-CM | POA: Diagnosis not present

## 2015-08-02 DIAGNOSIS — M4807 Spinal stenosis, lumbosacral region: Secondary | ICD-10-CM | POA: Diagnosis not present

## 2015-08-02 DIAGNOSIS — M5127 Other intervertebral disc displacement, lumbosacral region: Secondary | ICD-10-CM | POA: Diagnosis not present

## 2015-08-02 DIAGNOSIS — M549 Dorsalgia, unspecified: Secondary | ICD-10-CM | POA: Diagnosis present

## 2015-08-02 DIAGNOSIS — I10 Essential (primary) hypertension: Secondary | ICD-10-CM | POA: Diagnosis not present

## 2015-08-02 DIAGNOSIS — Z87891 Personal history of nicotine dependence: Secondary | ICD-10-CM | POA: Diagnosis not present

## 2015-08-02 LAB — BASIC METABOLIC PANEL
Anion gap: 12 (ref 5–15)
BUN: 14 mg/dL (ref 6–20)
CALCIUM: 9.9 mg/dL (ref 8.9–10.3)
CO2: 25 mmol/L (ref 22–32)
CREATININE: 0.61 mg/dL (ref 0.44–1.00)
Chloride: 103 mmol/L (ref 101–111)
GLUCOSE: 100 mg/dL — AB (ref 65–99)
Potassium: 3.9 mmol/L (ref 3.5–5.1)
Sodium: 140 mmol/L (ref 135–145)

## 2015-08-02 LAB — CBC
HCT: 41.7 % (ref 36.0–46.0)
Hemoglobin: 14.5 g/dL (ref 12.0–15.0)
MCH: 32.1 pg (ref 26.0–34.0)
MCHC: 34.8 g/dL (ref 30.0–36.0)
MCV: 92.3 fL (ref 78.0–100.0)
PLATELETS: 313 10*3/uL (ref 150–400)
RBC: 4.52 MIL/uL (ref 3.87–5.11)
RDW: 12.4 % (ref 11.5–15.5)
WBC: 7.9 10*3/uL (ref 4.0–10.5)

## 2015-08-02 LAB — HCG, SERUM, QUALITATIVE: Preg, Serum: NEGATIVE

## 2015-08-02 LAB — ABO/RH: ABO/RH(D): A POS

## 2015-08-02 LAB — SURGICAL PCR SCREEN
MRSA, PCR: NEGATIVE
Staphylococcus aureus: NEGATIVE

## 2015-08-02 NOTE — Progress Notes (Signed)
EKG- 02/14/15- EPIC

## 2015-08-02 NOTE — Patient Instructions (Signed)
Krystal Ellis  08/02/2015   Your procedure is scheduled on: 08/05/2015    Report to [facility] Main  Entrance take [place]  elevators to 3rd floor to  [place] at   0800 AM.  Call this number if you have problems the morning of surgery [phone]   Remember: ONLY 1 PERSON MAY GO WITH YOU TO SHORT STAY TO GET  READY MORNING OF [place].  Do not eat food or drink liquids :After Midnight.     Take these medicines the morning of surgery with A SIP OF WATER: Prozac, oxycodone if needed                                 You may not have any metal on your body including hair pins and              piercings  Do not wear jewelry, make-up, lotions, powders or perfumes, deodorant             Do not wear nail polish.  Do not shave  48 hours prior to surgery.                Do not bring valuables to the hospital. [place].  Contacts, dentures or bridgework may not be worn into surgery.  Leave suitcase in the car. After surgery it may be brought to your room.         Special Instructions: N/A              Please read over the following fact sheets you were given: _____________________________________________________________________             [facility] - Preparing for Surgery Before surgery, you can play an important role.  Because skin is not sterile, your skin needs to be as free of germs as possible.  You can reduce the number of germs on your skin by washing with CHG (chlorahexidine gluconate) soap before surgery.  CHG is an antiseptic cleaner which kills germs and bonds with the skin to continue killing germs even after washing. Please DO NOT use if you have an allergy to CHG or antibacterial soaps.  If your skin becomes reddened/irritated stop using the CHG and inform your nurse when you arrive at Short Stay. Do not shave (including legs and underarms) for at least 48 hours prior to the first CHG  shower.  You may shave your face/neck. Please follow these instructions carefully:  1.  Shower with CHG Soap the night before surgery and the  morning of Surgery.  2.  If you choose to wash your hair, wash your hair first as usual with your  normal  shampoo.  3.  After you shampoo, rinse your hair and body thoroughly to remove the  shampoo.                           4.  Use CHG as you would any other liquid soap.  You can apply chg directly  to the skin and wash                       Gently with a scrungie or clean  washcloth.  5.  Apply the CHG Soap to your body ONLY FROM THE NECK DOWN.   Do not use on face/ open                           Wound or open sores. Avoid contact with eyes, ears mouth and genitals (private parts).                       Wash face,  Genitals (private parts) with your normal soap.             6.  Wash thoroughly, paying special attention to the area where your surgery  will be performed.  7.  Thoroughly rinse your body with warm water from the neck down.  8.  DO NOT shower/wash with your normal soap after using and rinsing off  the CHG Soap.                9.  Pat yourself dry with a clean towel.            10.  Wear clean pajamas.            11.  Place clean sheets on your bed the night of your first shower and do not  sleep with pets. Day of Surgery : Do not apply any lotions/deodorants the morning of surgery.  Please wear clean clothes to the hospital/surgery center.  FAILURE TO FOLLOW THESE INSTRUCTIONS MAY RESULT IN THE CANCELLATION OF YOUR SURGERY PATIENT SIGNATURE_________________________________  NURSE SIGNATURE__________________________________  ________________________________________________________________________   Adam Phenix  An incentive spirometer is a tool that can help keep your lungs clear and active. This tool measures how well you are filling your lungs with each breath. Taking long deep breaths may help reverse or decrease the chance  of developing breathing (pulmonary) problems (especially infection) following:  A long period of time when you are unable to move or be active. BEFORE THE PROCEDURE   If the spirometer includes an indicator to show your best effort, your nurse or respiratory therapist will set it to a desired goal.  If possible, sit up straight or lean slightly forward. Try not to slouch.  Hold the incentive spirometer in an upright position. INSTRUCTIONS FOR USE  1. Sit on the edge of your bed if possible, or sit up as far as you can in bed or on a chair. 2. Hold the incentive spirometer in an upright position. 3. Breathe out normally. 4. Place the mouthpiece in your mouth and seal your lips tightly around it. 5. Breathe in slowly and as deeply as possible, raising the piston or the ball toward the top of the column. 6. Hold your breath for 3-5 seconds or for as long as possible. Allow the piston or ball to fall to the bottom of the column. 7. Remove the mouthpiece from your mouth and breathe out normally. 8. Rest for a few seconds and repeat Steps 1 through 7 at least 10 times every 1-2 hours when you are awake. Take your time and take a few normal breaths between deep breaths. 9. The spirometer may include an indicator to show your best effort. Use the indicator as a goal to work toward during each repetition. 10. After each set of 10 deep breaths, practice coughing to be sure your lungs are clear. If you have an incision (the cut made at the time of surgery), support your incision when coughing by  placing a pillow or rolled up towels firmly against it. Once you are able to get out of bed, walk around indoors and cough well. You may stop using the incentive spirometer when instructed by your caregiver.  RISKS AND COMPLICATIONS  Take your time so you do not get dizzy or light-headed.  If you are in pain, you may need to take or ask for pain medication before doing incentive spirometry. It is harder to  take a deep breath if you are having pain. AFTER USE  Rest and breathe slowly and easily.  It can be helpful to keep track of a log of your progress. Your caregiver can provide you with a simple table to help with this. If you are using the spirometer at home, follow these instructions: [place] IF:   You are having difficultly using the spirometer.  You have trouble using the spirometer as often as instructed.  Your pain medication is not giving enough relief while using the spirometer.  You develop fever of 100.5 F (38.1 C) or higher. SEEK IMMEDIATE MEDICAL CARE IF:   You cough up bloody sputum that had not been present before.  You develop fever of 102 F (38.9 C) or greater.  You develop worsening pain at or near the incision site. MAKE SURE YOU:   Understand these instructions.  Will watch your condition.  Will get help right away if you are not doing well or get worse. Document Released: 08/07/2006 Document Revised: 06/19/2011 Document Reviewed: 10/08/2006 [facility] Patient Information 2014 [place], [place].   ________________________________________________________________________

## 2015-08-04 ENCOUNTER — Ambulatory Visit: Payer: Self-pay | Admitting: Orthopedic Surgery

## 2015-08-04 NOTE — H&P (Signed)
Krystal Ellis is an 55 y.o. female.   Chief Complaint: back and R leg pain HPI: The patient is a 55 year old female who presents today for follow up of their back. The patient is being followed for their back pain. They are now 5 months and 1 week out from decompression. Symptoms reported today include: pain and leg pain (right). Current treatment includes: pain medications. The following medication has been used for pain control: Oxycodone. The patient reports their current pain level to be 8 / 10. The patient presents today following MRI.  Krystal Ellis follows up with her MRI. She has a recurrent disc herniation L5-S1, paracentral to the right with mass effect on the S1 nerve root. There is moderate foraminal stenosis on the left. There is mild listhesis at L4-5 on the right. She reports pain and numbness that radiates to the outer aspect of her foot. She had initial surgery over 5 months or so. She was doing well after it with absence of right leg pain following a decompression of L5-S1. She lifted a heavy tree, had immediate onset of back, buttock and radicular pain. She has minimal back pain.    Past Medical History  Diagnosis Date  . Family history of adverse reaction to anesthesia     mother who is deceased had hx of N/V   . Hypertension   . Anxiety   . Kidney stones   . Headache     hx of occasional migraine   . Arthritis     both index fingers   . Cancer ([place])     hx of precancerous lesions on back - removed     Past Surgical History  Procedure Laterality Date  . Lithotripsy    . Tubal ligation    . Lumbar laminectomy/decompression microdiscectomy Bilateral 02/17/2015    Procedure: MICRO LUMBAR DECOMPRESSION L5 - S1 ON RIGHT POSSIBLE LEFT;  Surgeon: Susa Day, MD;  Location: [facility]S;  Service: Orthopedics;  Laterality: Bilateral;    No family history on file. Social History:  reports that she quit smoking about 6 months ago. She has never used smokeless tobacco. She reports  that she does not drink alcohol or use illicit drugs.  Allergies: No Known Allergies   (Not in a hospital admission)  No results found for this or any previous visit (from the past 48 hour(s)). No results found.  Review of Systems  Constitutional: Negative.   HENT: Negative.   Eyes: Negative.   Respiratory: Negative.   Cardiovascular: Negative.   Gastrointestinal: Negative.   Genitourinary: Negative.   Musculoskeletal: Positive for back pain.  Skin: Negative.   Neurological: Positive for sensory change and focal weakness.  Psychiatric/Behavioral: Negative.     Last menstrual period 07/12/2015. Physical Exam  Constitutional: She is oriented to person, place, and time. She appears well-developed.  HENT:  Head: Normocephalic.  Eyes: Pupils are equal, round, and reactive to light.  Neck: Normal range of motion.  Cardiovascular: Normal rate.   Respiratory: Effort normal.  GI: Soft.  Musculoskeletal:  On exam, in moderate distress. Walks with an antalgic gait. There is abrasion over her knee she has from [facility] she indicates. Straight leg raises buttock, thigh and calf pain. She has diminished plantar flexion, diminished Achilles reflex, decreased sensation in the S1 dermatome.  Neurological: She is alert and oriented to person, place, and time.  Skin: Skin is warm and dry.  Psychiatric: She has a normal mood and affect.    MRI was reviewed  which demonstrates a large disc herniation paracentral to L5 with a effacement of the S1 nerve root.  Assessment/Plan 1. 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 2. 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  Extensive discussion with Ms. Desai concerning current pathology, relevant anatomy and treatment options. At this point in time, given the persistence of her symptoms despite conservative treatment and the presence of neurologic deficits, we discussed  revision disc decompression L5-S1. I had an extensive discussion of the risks and benefits of the lumbar decompression with the patient including bleeding, infection, damage to neurovascular structures, epidural fibrosis, CSF leak requiring repair. We also discussed increase in pain, adjacent segment disease, recurrent disc herniation, need for future surgery including repeat decompression and/or fusion. We also discussed risks of postoperative hematoma, paralysis, anesthetic complications including DVT, PE, death, cardiopulmonary dysfunction. In addition, the perioperative and postoperative courses were discussed in detail including the rehabilitative time and return to functional activity and work. I provided the patient with an illustrated handout and utilized the appropriate surgical models. Did commit to tobacco cessation, indicating the deleterious side effects upon that. We discussed if in the future she develops persistent recurrence, then she may require a fusion and in her case she will require a two-level fusion. This is again to address the neural compression. She is taking ibuprofen and occasional oxycodone at this point in time. We will proceed accordingly. She has declined any injection therapy which is reasonable given the neurologic deficit.  Plan Revision microlumbar decompression L5-S1 right  Cecilie Kicks., PA-C for Dr. Tonita Cong 08/04/2015, 5:12 PM

## 2015-08-05 ENCOUNTER — Encounter (HOSPITAL_COMMUNITY): Payer: Self-pay

## 2015-08-05 ENCOUNTER — Ambulatory Visit (HOSPITAL_COMMUNITY): Payer: BC Managed Care – PPO

## 2015-08-05 ENCOUNTER — Encounter (HOSPITAL_COMMUNITY)
Admission: RE | Disposition: A | Discharge status: Home / Self Care | Payer: Self-pay | Source: Ambulatory Visit | Attending: Specialist

## 2015-08-05 ENCOUNTER — Ambulatory Visit (HOSPITAL_COMMUNITY): Payer: BC Managed Care – PPO | Admitting: Anesthesiology

## 2015-08-05 ENCOUNTER — Ambulatory Visit (HOSPITAL_COMMUNITY)
Admission: RE | Admit: 2015-08-05 | Discharge: 2015-08-06 | Disposition: A | Discharge status: Home / Self Care | Payer: BC Managed Care – PPO | Source: Ambulatory Visit | Attending: Specialist | Admitting: Specialist

## 2015-08-05 DIAGNOSIS — M19042 Primary osteoarthritis, left hand: Secondary | ICD-10-CM | POA: Insufficient documentation

## 2015-08-05 DIAGNOSIS — Z87891 Personal history of nicotine dependence: Secondary | ICD-10-CM | POA: Insufficient documentation

## 2015-08-05 DIAGNOSIS — M5127 Other intervertebral disc displacement, lumbosacral region: Secondary | ICD-10-CM | POA: Diagnosis not present

## 2015-08-05 DIAGNOSIS — M48061 Spinal stenosis, lumbar region without neurogenic claudication: Secondary | ICD-10-CM | POA: Diagnosis present

## 2015-08-05 DIAGNOSIS — Z419 Encounter for procedure for purposes other than remedying health state, unspecified: Secondary | ICD-10-CM

## 2015-08-05 DIAGNOSIS — I10 Essential (primary) hypertension: Secondary | ICD-10-CM | POA: Insufficient documentation

## 2015-08-05 DIAGNOSIS — M19041 Primary osteoarthritis, right hand: Secondary | ICD-10-CM | POA: Insufficient documentation

## 2015-08-05 DIAGNOSIS — M5126 Other intervertebral disc displacement, lumbar region: Secondary | ICD-10-CM

## 2015-08-05 DIAGNOSIS — M4807 Spinal stenosis, lumbosacral region: Secondary | ICD-10-CM | POA: Insufficient documentation

## 2015-08-05 HISTORY — PX: LUMBAR LAMINECTOMY/DECOMPRESSION MICRODISCECTOMY: SHX5026

## 2015-08-05 HISTORY — DX: Other intervertebral disc displacement, lumbar region: M51.26

## 2015-08-05 LAB — TYPE AND SCREEN
ABO/RH(D): A POS
ANTIBODY SCREEN: NEGATIVE

## 2015-08-05 SURGERY — LUMBAR LAMINECTOMY/DECOMPRESSION MICRODISCECTOMY
Anesthesia: General | Site: Back | Laterality: Right

## 2015-08-05 MED ORDER — MIDAZOLAM HCL 5 MG/5ML IJ SOLN
INTRAMUSCULAR | Status: DC | PRN
  Administered 2015-08-05: 2 mg via INTRAVENOUS

## 2015-08-05 MED ORDER — EPHEDRINE SULFATE 50 MG/ML IJ SOLN
INTRAMUSCULAR | Status: DC | PRN
  Administered 2015-08-05 (×3): 10 mg via INTRAVENOUS

## 2015-08-05 MED ORDER — IRBESARTAN 300 MG PO TABS
300.0000 mg | ORAL_TABLET | Freq: Every day | ORAL | Status: DC
  Administered 2015-08-06: 300 mg via ORAL
  Filled 2015-08-05: qty 1

## 2015-08-05 MED ORDER — BISACODYL 5 MG PO TBEC: 5.0000 mg | DELAYED_RELEASE_TABLET | Freq: Every day | ORAL | Status: DC | PRN

## 2015-08-05 MED ORDER — CEFAZOLIN SODIUM-DEXTROSE 2-4 GM/100ML-% IV SOLN
INTRAVENOUS | Status: AC
  Filled 2015-08-05: qty 100

## 2015-08-05 MED ORDER — ONDANSETRON HCL 4 MG/2ML IJ SOLN: 4.0000 mg | INTRAMUSCULAR | Status: DC | PRN

## 2015-08-05 MED ORDER — LIDOCAINE HCL (CARDIAC) 20 MG/ML IV SOLN
INTRAVENOUS | Status: AC
  Filled 2015-08-05: qty 5

## 2015-08-05 MED ORDER — KCL IN DEXTROSE-NACL 20-5-0.45 MEQ/L-%-% IV SOLN
INTRAVENOUS | Status: AC
  Administered 2015-08-05: 14:00:00 via INTRAVENOUS
  Filled 2015-08-05 (×2): qty 1000

## 2015-08-05 MED ORDER — METHOCARBAMOL 500 MG PO TABS: 500.0000 mg | ORAL_TABLET | Freq: Three times a day (TID) | ORAL | Status: DC | PRN

## 2015-08-05 MED ORDER — MAGNESIUM CITRATE PO SOLN: 1.0000 | Freq: Once | ORAL | Status: DC | PRN

## 2015-08-05 MED ORDER — HYDROMORPHONE HCL 1 MG/ML IJ SOLN
INTRAMUSCULAR | Status: AC
  Filled 2015-08-05: qty 1

## 2015-08-05 MED ORDER — ACETAMINOPHEN 650 MG RE SUPP: 650.0000 mg | RECTAL | Status: DC | PRN

## 2015-08-05 MED ORDER — PHENOL 1.4 % MT LIQD: 1.0000 | OROMUCOSAL | Status: DC | PRN

## 2015-08-05 MED ORDER — KCL IN DEXTROSE-NACL 20-5-0.45 MEQ/L-%-% IV SOLN
INTRAVENOUS | Status: AC
  Filled 2015-08-05: qty 1000

## 2015-08-05 MED ORDER — ALUM & MAG HYDROXIDE-SIMETH 200-200-20 MG/5ML PO SUSP
30.0000 mL | Freq: Four times a day (QID) | ORAL | Status: DC | PRN
  Administered 2015-08-05: 30 mL via ORAL
  Filled 2015-08-05: qty 30

## 2015-08-05 MED ORDER — OXYCODONE-ACETAMINOPHEN 5-325 MG PO TABS: 1.0000 | ORAL_TABLET | ORAL | Status: DC | PRN

## 2015-08-05 MED ORDER — PROPOFOL 10 MG/ML IV BOLUS
INTRAVENOUS | Status: DC | PRN
  Administered 2015-08-05: 160 mg via INTRAVENOUS

## 2015-08-05 MED ORDER — FENTANYL CITRATE (PF) 100 MCG/2ML IJ SOLN
INTRAMUSCULAR | Status: DC | PRN
  Administered 2015-08-05 (×2): 100 ug via INTRAVENOUS
  Administered 2015-08-05: 50 ug via INTRAVENOUS

## 2015-08-05 MED ORDER — DOCUSATE SODIUM 100 MG PO CAPS: 100.0000 mg | ORAL_CAPSULE | Freq: Two times a day (BID) | ORAL | Status: DC | PRN

## 2015-08-05 MED ORDER — POLYETHYLENE GLYCOL 3350 17 G PO PACK: 17.0000 g | PACK | Freq: Every day | ORAL | Status: DC | PRN

## 2015-08-05 MED ORDER — THROMBIN 5000 UNITS EX SOLR
CUTANEOUS | Status: AC
  Filled 2015-08-05: qty 10000

## 2015-08-05 MED ORDER — ACETAMINOPHEN 325 MG PO TABS
650.0000 mg | ORAL_TABLET | ORAL | Status: DC | PRN
  Administered 2015-08-05: 650 mg via ORAL
  Filled 2015-08-05: qty 2

## 2015-08-05 MED ORDER — LACTATED RINGERS IV SOLN: INTRAVENOUS | Status: DC

## 2015-08-05 MED ORDER — SODIUM CHLORIDE 0.9 % IR SOLN
Status: AC
  Filled 2015-08-05: qty 1

## 2015-08-05 MED ORDER — BUPIVACAINE-EPINEPHRINE 0.5% -1:200000 IJ SOLN
INTRAMUSCULAR | Status: DC | PRN
  Administered 2015-08-05: 7 mL

## 2015-08-05 MED ORDER — BUPIVACAINE-EPINEPHRINE (PF) 0.5% -1:200000 IJ SOLN
INTRAMUSCULAR | Status: AC
  Filled 2015-08-05: qty 30

## 2015-08-05 MED ORDER — ROCURONIUM BROMIDE 100 MG/10ML IV SOLN
INTRAVENOUS | Status: DC | PRN
  Administered 2015-08-05: 50 mg via INTRAVENOUS

## 2015-08-05 MED ORDER — HYDROMORPHONE HCL 1 MG/ML IJ SOLN
0.5000 mg | INTRAMUSCULAR | Status: DC | PRN
  Administered 2015-08-05: 1 mg via INTRAVENOUS
  Filled 2015-08-05: qty 1

## 2015-08-05 MED ORDER — METHOCARBAMOL 500 MG PO TABS
500.0000 mg | ORAL_TABLET | Freq: Four times a day (QID) | ORAL | Status: DC | PRN
  Administered 2015-08-05 – 2015-08-06 (×3): 500 mg via ORAL
  Filled 2015-08-05 (×3): qty 1

## 2015-08-05 MED ORDER — SODIUM CHLORIDE 0.9 % IJ SOLN
INTRAMUSCULAR | Status: AC
  Filled 2015-08-05: qty 20

## 2015-08-05 MED ORDER — GLYCOPYRROLATE 0.2 MG/ML IJ SOLN
INTRAMUSCULAR | Status: AC
  Filled 2015-08-05: qty 1

## 2015-08-05 MED ORDER — CEFAZOLIN SODIUM-DEXTROSE 2-4 GM/100ML-% IV SOLN
2.0000 g | INTRAVENOUS | Status: AC
  Administered 2015-08-05: 2 g via INTRAVENOUS
  Filled 2015-08-05: qty 100

## 2015-08-05 MED ORDER — FENTANYL CITRATE (PF) 250 MCG/5ML IJ SOLN
INTRAMUSCULAR | Status: AC
  Filled 2015-08-05: qty 5

## 2015-08-05 MED ORDER — RISAQUAD PO CAPS
1.0000 | ORAL_CAPSULE | Freq: Every day | ORAL | Status: DC
  Administered 2015-08-05 – 2015-08-06 (×2): 1 via ORAL
  Filled 2015-08-05 (×2): qty 1

## 2015-08-05 MED ORDER — SUGAMMADEX SODIUM 200 MG/2ML IV SOLN
INTRAVENOUS | Status: DC | PRN
  Administered 2015-08-05: 200 mg via INTRAVENOUS

## 2015-08-05 MED ORDER — PROPOFOL 10 MG/ML IV BOLUS
INTRAVENOUS | Status: AC
  Filled 2015-08-05: qty 20

## 2015-08-05 MED ORDER — LACTATED RINGERS IV SOLN
INTRAVENOUS | Status: DC
  Administered 2015-08-05: 1000 mL via INTRAVENOUS

## 2015-08-05 MED ORDER — METHOCARBAMOL 1000 MG/10ML IJ SOLN
500.0000 mg | Freq: Four times a day (QID) | INTRAVENOUS | Status: DC | PRN
  Administered 2015-08-05: 500 mg via INTRAVENOUS
  Filled 2015-08-05 (×2): qty 5

## 2015-08-05 MED ORDER — LACTATED RINGERS IV SOLN
INTRAVENOUS | Status: DC
  Administered 2015-08-05 (×2): via INTRAVENOUS

## 2015-08-05 MED ORDER — METHOCARBAMOL 500 MG PO TABS: 500.0000 mg | ORAL_TABLET | Freq: Four times a day (QID) | ORAL | Status: DC | PRN

## 2015-08-05 MED ORDER — SUGAMMADEX SODIUM 200 MG/2ML IV SOLN
INTRAVENOUS | Status: AC
  Filled 2015-08-05: qty 2

## 2015-08-05 MED ORDER — AMLODIPINE BESYLATE 5 MG PO TABS
5.0000 mg | ORAL_TABLET | Freq: Every day | ORAL | Status: DC
  Administered 2015-08-06: 5 mg via ORAL
  Filled 2015-08-05: qty 1

## 2015-08-05 MED ORDER — CEFAZOLIN SODIUM-DEXTROSE 2-4 GM/100ML-% IV SOLN
2.0000 g | Freq: Three times a day (TID) | INTRAVENOUS | Status: AC
  Administered 2015-08-05 – 2015-08-06 (×2): 2 g via INTRAVENOUS
  Filled 2015-08-05 (×2): qty 100

## 2015-08-05 MED ORDER — THROMBIN 5000 UNITS EX SOLR
CUTANEOUS | Status: DC | PRN
  Administered 2015-08-05: 10000 [IU] via TOPICAL

## 2015-08-05 MED ORDER — LIDOCAINE HCL (CARDIAC) 20 MG/ML IV SOLN
INTRAVENOUS | Status: DC | PRN
  Administered 2015-08-05: 80 mg via INTRAVENOUS

## 2015-08-05 MED ORDER — HYDROMORPHONE HCL 1 MG/ML IJ SOLN
0.2500 mg | INTRAMUSCULAR | Status: DC | PRN
  Administered 2015-08-05 (×4): 0.5 mg via INTRAVENOUS

## 2015-08-05 MED ORDER — GABAPENTIN 300 MG PO CAPS
300.0000 mg | ORAL_CAPSULE | Freq: Two times a day (BID) | ORAL | Status: DC
  Administered 2015-08-05 – 2015-08-06 (×2): 300 mg via ORAL
  Filled 2015-08-05 (×3): qty 1

## 2015-08-05 MED ORDER — FLUOXETINE HCL 10 MG PO CAPS
10.0000 mg | ORAL_CAPSULE | Freq: Every day | ORAL | Status: DC
  Administered 2015-08-06: 10 mg via ORAL
  Filled 2015-08-05: qty 1

## 2015-08-05 MED ORDER — MIDAZOLAM HCL 2 MG/2ML IJ SOLN
INTRAMUSCULAR | Status: AC
  Filled 2015-08-05: qty 2

## 2015-08-05 MED ORDER — OLMESARTAN-AMLODIPINE-HCTZ 40-5-25 MG PO TABS: 1.0000 | ORAL_TABLET | Freq: Every day | ORAL | Status: DC

## 2015-08-05 MED ORDER — CHLORZOXAZONE 500 MG PO TABS
750.0000 mg | ORAL_TABLET | Freq: Three times a day (TID) | ORAL | Status: DC
  Administered 2015-08-05 – 2015-08-06 (×3): 750 mg via ORAL
  Filled 2015-08-05 (×5): qty 2

## 2015-08-05 MED ORDER — DOCUSATE SODIUM 100 MG PO CAPS
100.0000 mg | ORAL_CAPSULE | Freq: Two times a day (BID) | ORAL | Status: DC
  Administered 2015-08-05 – 2015-08-06 (×2): 100 mg via ORAL
  Filled 2015-08-05 (×3): qty 1

## 2015-08-05 MED ORDER — HYDROCHLOROTHIAZIDE 25 MG PO TABS
25.0000 mg | ORAL_TABLET | Freq: Every day | ORAL | Status: DC
  Administered 2015-08-06: 25 mg via ORAL
  Filled 2015-08-05: qty 1

## 2015-08-05 MED ORDER — HYDROCODONE-ACETAMINOPHEN 5-325 MG PO TABS
1.0000 | ORAL_TABLET | ORAL | Status: DC | PRN
  Administered 2015-08-05 – 2015-08-06 (×4): 2 via ORAL
  Filled 2015-08-05 (×4): qty 2

## 2015-08-05 MED ORDER — OXYCODONE-ACETAMINOPHEN 5-325 MG PO TABS
1.0000 | ORAL_TABLET | ORAL | Status: DC | PRN
Start: 2015-08-05 — End: 2017-01-22

## 2015-08-05 MED ORDER — SODIUM CHLORIDE 0.9 % IR SOLN
Status: DC | PRN
  Administered 2015-08-05: 500 mL

## 2015-08-05 MED ORDER — ONDANSETRON HCL 4 MG/2ML IJ SOLN
INTRAMUSCULAR | Status: AC
  Filled 2015-08-05: qty 2

## 2015-08-05 MED ORDER — MENTHOL 3 MG MT LOZG: 1.0000 | LOZENGE | OROMUCOSAL | Status: DC | PRN

## 2015-08-05 MED ORDER — AMLODIPINE BESYLATE 5 MG PO TABS
5.0000 mg | ORAL_TABLET | Freq: Once | ORAL | Status: AC
  Administered 2015-08-05: 5 mg via ORAL
  Filled 2015-08-05: qty 1

## 2015-08-05 SURGICAL SUPPLY — 44 items
BAG ZIPLOCK 12X15 (MISCELLANEOUS) IMPLANT
CLOSURE WOUND 1/2 X4 (GAUZE/BANDAGES/DRESSINGS) ×1
CLOTH 2% CHLOROHEXIDINE 3PK (PERSONAL CARE ITEMS) ×3 IMPLANT
DRAPE MICROSCOPE LEICA (MISCELLANEOUS) ×3 IMPLANT
DRAPE SHEET LG 3/4 BI-LAMINATE (DRAPES) IMPLANT
DRAPE SURG 17X11 SM STRL (DRAPES) ×3 IMPLANT
DRAPE UTILITY XL STRL (DRAPES) ×3 IMPLANT
DRSG AQUACEL AG ADV 3.5X 4 (GAUZE/BANDAGES/DRESSINGS) ×3 IMPLANT
DRSG AQUACEL AG ADV 3.5X 6 (GAUZE/BANDAGES/DRESSINGS) IMPLANT
DURAPREP 26ML APPLICATOR (WOUND CARE) ×3 IMPLANT
DURASEAL SPINE SEALANT 3ML (MISCELLANEOUS) IMPLANT
ELECT BLADE TIP CTD 4 INCH (ELECTRODE) ×3 IMPLANT
ELECT REM PT RETURN 9FT ADLT (ELECTROSURGICAL) ×3
ELECTRODE REM PT RTRN 9FT ADLT (ELECTROSURGICAL) ×1 IMPLANT
GLOVE BIOGEL PI IND STRL 7.0 (GLOVE) ×2 IMPLANT
GLOVE BIOGEL PI INDICATOR 7.0 (GLOVE) ×4
GLOVE SURG SS PI 7.0 STRL IVOR (GLOVE) ×6 IMPLANT
GLOVE SURG SS PI 7.5 STRL IVOR (GLOVE) ×6 IMPLANT
GLOVE SURG SS PI 8.0 STRL IVOR (GLOVE) ×6 IMPLANT
GOWN STRL REUS W/TWL XL LVL3 (GOWN DISPOSABLE) ×12 IMPLANT
IV CATH 14GX2 1/4 (CATHETERS) IMPLANT
KIT BASIN OR (CUSTOM PROCEDURE TRAY) ×3 IMPLANT
KIT POSITIONING SURG ANDREWS (MISCELLANEOUS) ×3 IMPLANT
MANIFOLD NEPTUNE II (INSTRUMENTS) ×3 IMPLANT
NEEDLE SPNL 18GX3.5 QUINCKE PK (NEEDLE) ×6 IMPLANT
PACK LAMINECTOMY ORTHO (CUSTOM PROCEDURE TRAY) ×3 IMPLANT
PATTIES SURGICAL .5 X.5 (GAUZE/BANDAGES/DRESSINGS) IMPLANT
PATTIES SURGICAL .75X.75 (GAUZE/BANDAGES/DRESSINGS) ×3 IMPLANT
RUBBERBAND STERILE (MISCELLANEOUS) ×6 IMPLANT
SPONGE SURGIFOAM ABS GEL 100 (HEMOSTASIS) ×3 IMPLANT
STAPLER VISISTAT (STAPLE) IMPLANT
STRIP CLOSURE SKIN 1/2X4 (GAUZE/BANDAGES/DRESSINGS) ×2 IMPLANT
SUT NURALON 4 0 TR CR/8 (SUTURE) IMPLANT
SUT PROLENE 3 0 PS 2 (SUTURE) IMPLANT
SUT VIC AB 1 CT1 27 (SUTURE)
SUT VIC AB 1 CT1 27XBRD ANTBC (SUTURE) IMPLANT
SUT VIC AB 1-0 CT2 27 (SUTURE) ×3 IMPLANT
SUT VIC AB 2-0 CT1 27 (SUTURE)
SUT VIC AB 2-0 CT1 TAPERPNT 27 (SUTURE) IMPLANT
SUT VIC AB 2-0 CT2 27 (SUTURE) IMPLANT
SYR 3ML LL SCALE MARK (SYRINGE) IMPLANT
TOWEL OR 17X26 10 PK STRL BLUE (TOWEL DISPOSABLE) ×3 IMPLANT
TRAY FOLEY CATH 14FR (SET/KITS/TRAYS/PACK) ×3 IMPLANT
YANKAUER SUCT BULB TIP NO VENT (SUCTIONS) ×3 IMPLANT

## 2015-08-05 NOTE — Anesthesia Preprocedure Evaluation (Addendum)
Anesthesia Evaluation  Patient identified by MRN, date of birth, ID band Patient awake    Reviewed: Allergy & Precautions, NPO status , Patient's Chart, lab work & pertinent test results  History of Anesthesia Complications (+) Family history of anesthesia reaction  Airway Mallampati: II  TM Distance: >3 FB Neck ROM: Full    Dental  (+) Dental Advisory Given, Chipped Chipped right upper front tooth:   Pulmonary former smoker,    Pulmonary exam normal breath sounds clear to auscultation       Cardiovascular hypertension, Pt. on medications Normal cardiovascular exam Rhythm:Regular Rate:Normal     Neuro/Psych  Headaches, Anxiety    GI/Hepatic negative GI ROS, Neg liver ROS,   Endo/Other  negative endocrine ROS  Renal/GU Renal disease  negative genitourinary   Musculoskeletal  (+) Arthritis ,   Abdominal   Peds negative pediatric ROS (+)  Hematology negative hematology ROS (+)   Anesthesia Other Findings   Reproductive/Obstetrics negative OB ROS                            Anesthesia Physical Anesthesia Plan  ASA: II  Anesthesia Plan: General   Post-op Pain Management:    Induction: Intravenous  Airway Management Planned: Oral ETT  Additional Equipment:   Intra-op Plan:   Post-operative Plan: Extubation in OR  Informed Consent:   Plan Discussed with: Surgeon  Anesthesia Plan Comments:         Anesthesia Quick Evaluation

## 2015-08-05 NOTE — Anesthesia Postprocedure Evaluation (Signed)
Anesthesia Post Note  Patient: Krystal Ellis  Procedure(s) Performed: Procedure(s) (LRB): REVISION MICRO LUMBAR DECOMPRESSION L5 - S1 ON THE RIGHT  (Right)  Patient location during evaluation: PACU Anesthesia Type: General Level of consciousness: awake and alert Pain management: pain level controlled Vital Signs Assessment: post-procedure vital signs reviewed and stable Respiratory status: spontaneous breathing, nonlabored ventilation, respiratory function stable and patient connected to nasal cannula oxygen Cardiovascular status: blood pressure returned to baseline and stable Postop Assessment: no signs of nausea or vomiting Anesthetic complications: no    Last Vitals:  Filed Vitals:   08/05/15 1315 08/05/15 1330  BP: 111/59 111/59  Pulse: 90 87  Temp:  36.5 C  Resp: 10 10    Last Pain:  Filed Vitals:   08/05/15 1332  PainSc: 6                  Kirklin Mcduffee L

## 2015-08-05 NOTE — Transfer of Care (Signed)
Immediate Anesthesia Transfer of Care Note  Patient: Krystal Ellis  Procedure(s) Performed: Procedure(s): REVISION MICRO LUMBAR DECOMPRESSION L5 - S1 ON THE RIGHT  (Right)  Patient Location: PACU  Anesthesia Type:General  Level of Consciousness: sedated, patient cooperative and responds to stimulation  Airway & Oxygen Therapy: Patient Spontanous Breathing and Patient connected to face mask oxygen  Post-op Assessment: Report given to RN and Post -op Vital signs reviewed and stable  Post vital signs: Reviewed and stable  Last Vitals:  Filed Vitals:   08/05/15 0759  BP: 122/58  Pulse: 70  Temp: 36.8 C  Resp: 18    Last Pain:  Filed Vitals:   08/05/15 0904  PainSc: 2       Patients Stated Pain Goal: 3 ([id number])  Complications: No apparent anesthesia complications

## 2015-08-05 NOTE — Brief Op Note (Signed)
08/05/2015  11:36 AM  PATIENT:  Krystal Ellis  55 y.o. female  PRE-OPERATIVE DIAGNOSIS:  Recurrent HNP L5 - S1  POST-OPERATIVE DIAGNOSIS:  * No post-op diagnosis entered *  PROCEDURE:  Procedure(s): REVISION MICRO LUMBAR DECOMPRESSION L5 - S1 ON THE RIGHT  (Right)  SURGEON:  Surgeon(s) and Role:    * Susa Day, MD - Primary  PHYSICIAN ASSISTANT:   ASSISTANTS: [place]   ANESTHESIA:   general  EBL:  Total I/O In: 1500 [I.V.:1500] Out: 150 [Urine:100; Blood:50]  BLOOD ADMINISTERED:none  DRAINS: none   LOCAL MEDICATIONS USED:  [place]     SPECIMEN:  No Specimen  DISPOSITION OF SPECIMEN:  N/A  COUNTS:  YES  TOURNIQUET:  * No tourniquets in log *  DICTATION: .Other Dictation: Dictation Number [id number]  PLAN OF CARE: Admit for overnight observation  PATIENT DISPOSITION:  PACU - hemodynamically stable.   Delay start of Pharmacological VTE agent (>24hrs) due to surgical blood loss or risk of bleeding: yes

## 2015-08-05 NOTE — Discharge Instructions (Signed)
Walk As Tolerated utilizing back precautions.  No bending, twisting, or lifting.  No driving for 2 weeks.   °Aquacel dressing may remain in place until follow up. May shower with aquacel dressing in place. If the dressing peels off or becomes saturated, you may remove aquacel dressing and place gauze and tape dressing which should be kept clean and dry and changed daily. Do not remove steri-strips if they are present. °See Dr. Teresea Donley in office in 10 to 14 days. Begin taking aspirin 81mg per day starting 4 days after your surgery if not allergic to aspirin or on another blood thinner. °Walk daily even outside. Use a cane or walker only if necessary. °Avoid sitting on soft sofas. ° °

## 2015-08-05 NOTE — Anesthesia Procedure Notes (Signed)
Procedure Name: Intubation Performed by: Gearldene Fiorenza J Pre-anesthesia Checklist: Patient identified, Emergency Drugs available, Suction available, Patient being monitored and Timeout performed Patient Re-evaluated:Patient Re-evaluated prior to inductionOxygen Delivery Method: Circle system utilized Preoxygenation: Pre-oxygenation with 100% oxygen Intubation Type: IV induction Ventilation: Mask ventilation without difficulty Laryngoscope Size: Mac and 3 Grade View: Grade I Tube type: Oral Tube size: 7.0 mm Number of attempts: 1 Airway Equipment and Method: Stylet Placement Confirmation: ETT inserted through vocal cords under direct vision,  positive ETCO2,  CO2 detector and breath sounds checked- equal and bilateral Secured at: 21 cm Tube secured with: Tape Dental Injury: Teeth and Oropharynx as per pre-operative assessment        

## 2015-08-06 DIAGNOSIS — M5127 Other intervertebral disc displacement, lumbosacral region: Secondary | ICD-10-CM | POA: Diagnosis not present

## 2015-08-06 MED ORDER — ASPIRIN EC 81 MG PO TBEC: 81.0000 mg | DELAYED_RELEASE_TABLET | Freq: Every day | ORAL | Status: AC

## 2015-08-06 NOTE — Progress Notes (Signed)
Foley discontinued on April 28,2017th per M.D. Order. Pt  Due to void by 12:30 pm today.

## 2015-08-06 NOTE — Evaluation (Signed)
Physical Therapy Evaluation Patient Details Name: Krystal Ellis MRN: [id number] DOB: 1960/06/28 Today's Date: 08/06/2015   History of Present Illness  Pt s/p L5-S1 Revision Lumbar Decompression.  Previous surg 11/16  Clinical Impression  Pt s/p back surgery presents with functional mobility limitations 2* post op back precautions and ongoing R LE pain.  Pt plans dc home with assist of family.    Follow Up Recommendations No PT follow up    Equipment Recommendations  None recommended by PT    Recommendations for Other Services OT consult     Precautions / Restrictions Precautions Precautions: Back Precaution Booklet Issued: Yes (comment) Precaution Comments: Reviewed all back precautions Restrictions Weight Bearing Restrictions: No      Mobility  Bed Mobility Overal bed mobility: Needs Assistance Bed Mobility: Supine to Sit;Sit to Supine     Supine to sit: Min guard Sit to supine: Min guard   General bed mobility comments: min cues for correct log roll technique  Transfers Overall transfer level: Needs assistance Equipment used: Rolling walker (2 wheeled) Transfers: Sit to/from Stand Sit to Stand: Min guard         General transfer comment: Cues for transition position and use of UEs to self assist  Ambulation/Gait Ambulation/Gait assistance: Min guard;Supervision Ambulation Distance (Feet): 100 Feet Assistive device: Rolling walker (2 wheeled) Gait Pattern/deviations: Step-to pattern;Decreased step length - right;Decreased step length - left;Shuffle;Trunk flexed Gait velocity: decr Gait velocity interpretation: Below normal speed for age/gender General Gait Details: Step to gait 2* increased R LE pain with increased R LE WB  Stairs Stairs: Yes Stairs assistance:  (Reviewed verbally, pt declines to attempt)        Wheelchair Mobility    Modified Rankin (Stroke Patients Only)       Balance                                             Pertinent Vitals/Pain Pain Assessment: 0-10 Pain Score: 6  Pain Location: R leg Pain Descriptors / Indicators: Aching;Sore Pain Intervention(s): Limited activity within patient's tolerance;Premedicated before session;Monitored during session    [place] expects to be discharged to:: Private residence Living Arrangements: Spouse/significant other Available Help at Discharge: Family Type of Home: House Home Access: Stairs to enter Entrance Stairs-Rails: Right;Left;Can reach both Entrance Stairs-Number of Steps: 3 Home Layout: One level [place]: Grab bars - tub/shower;Shower seat;Bedside commode;Walker - 2 wheels Additional Comments: Pt states son will assist    Prior Function Level of Independence: Independent               Hand Dominance   Dominant Hand: Right    Extremity/Trunk Assessment   Upper Extremity Assessment: Overall WFL for tasks assessed           Lower Extremity Assessment: Overall WFL for tasks assessed         Communication   Communication: No difficulties  Cognition Arousal/Alertness: Awake/alert Behavior During Therapy: WFL for tasks assessed/performed Overall Cognitive Status: Within Functional Limits for tasks assessed                      General Comments      Exercises General Exercises - Lower Extremity Ankle Circles/Pumps: AROM;Both;15 reps;Supine      Assessment/Plan    PT Assessment Patent does not need any further PT services  PT Diagnosis Difficulty walking  PT Problem List Decreased activity tolerance;Decreased balance;Decreased mobility;Decreased knowledge of use of DME;Pain;Decreased knowledge of precautions  PT Treatment Interventions DME instruction;Gait training;Functional mobility training;Stair training;Therapeutic activities;Therapeutic exercise;Patient/family education   PT Goals (Current goals can be found in the Care Plan section) Acute Rehab PT Goals Patient Stated  Goal: Not have to have another surgery like this PT Goal Formulation: With patient Time For Goal Achievement: 08/07/15 Potential to Achieve Goals: Good    Frequency     Barriers to discharge        Co-evaluation               End of Session   Activity Tolerance: Patient tolerated treatment well;Patient limited by pain Patient left: in bed;with call bell/phone within reach Nurse Communication: Mobility status         Time: [id number] PT Time Calculation (min) (ACUTE ONLY): 28 min   Charges:   PT Evaluation $PT Eval Low Complexity: 1 Procedure PT Treatments $Gait Training: 8-22 mins   PT G Codes:        Salome Hautala 08-29-2015, 12:12 PM

## 2015-08-06 NOTE — Evaluation (Signed)
Occupational Therapy Evaluation Patient Details Name: Krystal Ellis MRN: [id number] DOB: September 25, 1960 Today's Date: 08/06/2015    History of Present Illness Pt s/p L5-S1 Revision Lumbar Decompression.  Previous surg 11/16   Clinical Impression   This 55 year old female was admitted for the above surgery. All education was completed.  No further OT is needed at this time    Follow Up Recommendations  No OT follow up;Supervision - Intermittent    Equipment Recommendations  None recommended by OT    Recommendations for Other Services       Precautions / Restrictions Precautions Precautions: Back Precaution Booklet Issued: Yes (comment) Precaution Comments: Reviewed all back precautions Restrictions Weight Bearing Restrictions: No      Mobility Bed Mobility         Supine to sit: Supervision Sit to supine: Supervision      Transfers   Equipment used: Rolling walker (2 wheeled) Transfers: Sit to/from Stand Sit to Stand: Supervision              Balance                                            ADL Overall ADL's : Needs assistance/impaired     Grooming: Set up;Sitting;Oral care   Upper Body Bathing: Set up;Sitting   Lower Body Bathing: Supervison/ safety;Sit to/from stand   Upper Body Dressing : Set up;Sitting   Lower Body Dressing: Minimal assistance;Sit to/from stand                 General ADL Comments: reviewed all precautions in context of ADLs.  Pt got dressed during session and was able to cross legs.  Helped with pants as they had resistance against pants. Pt is able to cross legs for this comfortably and she also has AE at home.  Pt had just used bathroom and was min guard for ambulation with PT.  Educated on use of RW to lean on when backing into shower stall.  Pt verbalizes understanding of all education     Vision     Perception     Praxis      Pertinent Vitals/Pain Pain Assessment: 0-10 Pain Score:  (2-8  (increased with activity)) Pain Location: R back of thigh Pain Descriptors / Indicators: Aching Pain Intervention(s): Limited activity within patient's tolerance;Monitored during session;Premedicated before session;Repositioned;Ice applied     Hand Dominance Right   Extremity/Trunk Assessment Upper Extremity Assessment Upper Extremity Assessment: Overall WFL for tasks assessed           Communication Communication Communication: No difficulties   Cognition Arousal/Alertness: Awake/alert Behavior During Therapy: WFL for tasks assessed/performed Overall Cognitive Status: Within Functional Limits for tasks assessed                     General Comments       Exercises       Shoulder Instructions      Home Living Family/patient expects to be discharged to:: Private residence Living Arrangements: Spouse/significant other Available Help at Discharge: Family               Bathroom Shower/Tub: Walk-in shower;Curtain   Bathroom Toilet: Standard     Home Equipment: Grab bars - tub/shower;Shower seat;Bedside commode;Walker - 2 wheels   Additional Comments: Pt states son will assist      Prior Functioning/Environment Level of  Independence: Independent             OT Diagnosis: Acute pain   OT Problem List:     OT Treatment/Interventions:      OT Goals(Current goals can be found in the care plan section) Acute Rehab OT Goals Patient Stated Goal: Not have to have another surgery like this OT Goal Formulation: All assessment and education complete, DC therapy  OT Frequency:     Barriers to D/C:            Co-evaluation              End of Session    Activity Tolerance: Patient limited by pain Patient left: in bed;with call bell/phone within reach   Time: [id number] and [id number] OT Time Calculation (min): 25 min Charges:  OT General Charges $OT Visit: 1 Procedure OT Evaluation $OT Eval Low Complexity: 1 Procedure G-Codes: OT  G-codes **NOT FOR INPATIENT CLASS** Functional Assessment Tool Used: clinical observation and judgment Functional Limitation: Self care Self Care Current Status [id number]): At least 20 percent but less than 40 percent impaired, limited or restricted Self Care Goal Status [id number]): At least 20 percent but less than 40 percent impaired, limited or restricted Self Care Discharge Status [id number]): At least 20 percent but less than 40 percent impaired, limited or restricted  Ezariah Nace 08/06/2015, 3:05 PM  Lesle Chris, OTR/L [phone] 08/06/2015

## 2015-08-06 NOTE — Op Note (Signed)
Krystal Ellis, VI NO.:  [id number]  MEDICAL RECORD NO.:  [id number]  LOCATION:  [facility]                         FACILITY:  [facility]  PHYSICIAN:  Susa Day, M.D.    DATE OF BIRTH:  Dec 12, 1960  DATE OF PROCEDURE:  08/05/2015 DATE OF DISCHARGE:                              OPERATIVE REPORT   PREOPERATIVE DIAGNOSIS:  Recurrent disk herniation, spinal stenosis, L5- S1, right.  POSTOPERATIVE DIAGNOSIS:  Recurrent disk herniation, spinal stenosis, L5- S1, right.  PROCEDURE PERFORMED: 1. Revision lumbar decompression L5-S1, right. 2. Foraminotomies L5-S1, right. 3. Microdiskectomy L5-S1, right. 4. 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  ANESTHESIA:  General.  ASSISTANT:  Lacie Draft, PA  HISTORY:  A 55 year old female, history of lumbar decompression in the past, disk degeneration, minimal listhesis at 4-5, then after decompression, she had bent over, lifted a log and a recurrent disk herniation was noted, right lower extremity radicular pain, L5-S1 nerve root distribution, and a small recurrent disk herniation noted on her MRI.  She had a disk degeneration.  She had neural foraminal stenosis bilaterally and left-sided symptoms.  Her exam revealed __________ plantar flexion, decreased Achilles reflex, QAMARKER] S1 dermatome with recurrent disk herniation, failing conservative treatment was indicated for revision decompression.  We discussed risks and benefits including bleeding, infection, damage to neurovascular structures, DVT, PE, anesthetic complication, need for fusion in the future, also possibility of symptomatic spinal stenosis on the left.  TECHNIQUE:  Patient in supine position, after induction of adequate general anesthesia, 2 g Kefzol, placed prone on the [place] frame.  All bony prominences were well padded.  Lumbar region was prepped and draped in usual sterile fashion.  Two 18-gauge spinal needle was utilized to localize  the 5-1 interspace confirmed with x-ray.  Incision was made from the spinous process 5-1.  Subcutaneous tissue was dissected. Electrocautery was utilized to achieve hemostasis.  Dorsolumbar fascia divided in line of skin incision.  Paraspinous muscle elevated from lamina L5-S1.  McCulloch retractors placed.  Operating microscope draped and brought in the surgical field.  Epidural fibrosis was noted.  We then skeletonized the previous laminotomy defect with a straight curette, mobilizing the residual ligamentum flavum as well as the epidural fibrosis laterally.  We were able to place the patty between the thecal sac and the S1 nerve root, retracted it with a neural probe and performed a generous foraminotomy of S1, and revision hemilaminotomy.  Once we have identified the nerve root, we followed it proximally.  Meticulously developing a plane between the previous epidural fibrosis and the neural elements.  Utilizing straight curette, we decompressed the lateral recess to the medial border of the pedicle. Then, we mobilized the cephalad portion detaching it from this caudad edge of 5 utilizing a straight micro curette, enlarged the hemilaminotomy with a 2 mm Kerrison.  Following this, we gently mobilized it medially.  There were multiple veins tethering the S1 nerve root.  They were cauterized and divided further mobilizing superior articular process of 5 was noted to be impinging within the 5 foramen, therefore we removed a portion of this with a 2 mm Kerrison.  A small disk herniation was noted, extruded,  evacuated with the suction. Further mobilizing the S1 nerve root, again there was an extensive venous plexus shingling of the L5-S1 space and facet hypertrophy. Again, we decompressed the lateral recess to the medial border of the pedicle.  We identified the shoulder of the root of S1 axilla and beneath the thecal sac, there was no residual disk herniation.  We mobilized the 1 cm medial  to the pedicle without tension.  Felt a combination of facet hypertrophy, small disk herniation, as well as a tethering epidural venous plexus was responsible for S1 nerve root symptoms.  A neural probe passed freely out of foramen of 5 and up to the pedicle of 5 indicating adequate space for the exiting 5 root.  No CSF leakage or active bleeding.  We obtained a confirmatory radiograph at the disk space.  Copiously irrigated using thrombin-soaked Gelfoam and bipolar electrocautery to achieve hemostasis.  Removed McCulloch retractor, irrigated the paraspinous musculature.  No active bleeding. Dorsolumbar fascia reapproximated with 1 Vicryl, subcu with 2, and skin with Prolene.  Sterile dressing applied, placed supine on the hospital bed, extubated without difficulty, and transported to the recovery room in satisfactory condition.  The patient tolerated the procedure well.  No complications.  Assistant, Lacie Draft, [place].  Minimal blood loss.     Susa Day, M.D.     Geralynn Rile  D:  08/05/2015  T:  08/05/2015  Job:  [id number]

## 2015-08-06 NOTE — Discharge Summary (Signed)
Physician Discharge Summary   Patient ID: Krystal Ellis MRN: [id number] DOB/AGE: [birth year] 55 y.o.  Admit date: 08/05/2015 Discharge date: 08/06/2015  Primary Diagnosis:   Recurrent HNP L5 - S1  Admission Diagnoses:  Past Medical History  Diagnosis Date  . Family history of adverse reaction to anesthesia     mother who is deceased had hx of N/V   . Hypertension   . Anxiety   . Kidney stones   . Headache     hx of occasional migraine   . Arthritis     both index fingers   . Cancer ([place])     hx of precancerous lesions on back - removed    Discharge Diagnoses:   Principal Problem:   HNP (herniated nucleus pulposus), lumbar Active Problems:   Spinal stenosis of lumbar region  Procedure:  Procedure(s) (LRB): REVISION MICRO LUMBAR DECOMPRESSION L5 - S1 ON THE RIGHT  (Right)   Consults: None  HPI:  see H&P    Laboratory Data: Hospital Outpatient Visit on 08/02/2015  Component Date Value Ref Range Status  . Sodium 08/02/2015 140  135 - 145 mmol/L Final  . Potassium 08/02/2015 3.9  3.5 - 5.1 mmol/L Final  . Chloride 08/02/2015 103  101 - 111 mmol/L Final  . CO2 08/02/2015 25  22 - 32 mmol/L Final  . Glucose, Bld 08/02/2015 100* 65 - 99 mg/dL Final  . BUN 08/02/2015 14  6 - 20 mg/dL Final  . Creatinine, Ser 08/02/2015 0.61  0.44 - 1.00 mg/dL Final  . Calcium 08/02/2015 9.9  8.9 - 10.3 mg/dL Final  . GFR calc non Af Amer 08/02/2015 >60  >60 mL/min Final  . GFR calc Af Amer 08/02/2015 >60  >60 mL/min Final   Comment: (NOTE) The eGFR has been calculated using the CKD EPI equation. This calculation has not been validated in all clinical situations. eGFR's persistently <60 mL/min signify possible Chronic Kidney Disease.   . Anion gap 08/02/2015 12  5 - 15 Final  . WBC 08/02/2015 7.9  4.0 - 10.5 K/uL Final  . RBC 08/02/2015 4.52  3.87 - 5.11 MIL/uL Final  . Hemoglobin 08/02/2015 14.5  12.0 - 15.0 g/dL Final  . HCT 08/02/2015 41.7  36.0 - 46.0 % Final  . MCV 08/02/2015  92.3  78.0 - 100.0 fL Final  . MCH 08/02/2015 32.1  26.0 - 34.0 pg Final  . MCHC 08/02/2015 34.8  30.0 - 36.0 g/dL Final  . RDW 08/02/2015 12.4  11.5 - 15.5 % Final  . Platelets 08/02/2015 313  150 - 400 K/uL Final  . ABO/RH(D) 08/02/2015 A POS   Final  . Antibody Screen 08/02/2015 NEG   Final  . Sample Expiration 08/02/2015 08/08/2015   Final  . Extend sample reason 08/02/2015 NO TRANSFUSIONS OR PREGNANCY IN THE PAST 3 MONTHS   Final  . MRSA, PCR 08/02/2015 NEGATIVE  NEGATIVE Final  . Staphylococcus aureus 08/02/2015 NEGATIVE  NEGATIVE Final   Comment:        The Xpert SA Assay (FDA approved for NASAL specimens in patients over 63 years of age), is one component of a comprehensive surveillance program.  Test performance has been validated by [facility] for patients greater than or equal to 34 year old. It is not intended to diagnose infection nor to guide or monitor treatment.   . Preg, Serum 08/02/2015 NEGATIVE  NEGATIVE Final   Comment:        THE SENSITIVITY OF THIS METHODOLOGY IS >10 mIU/mL.   Marland Kitchen  ABO/RH(D) 08/02/2015 A POS   Final   No results for input(s): HGB in the last 72 hours. No results for input(s): WBC, RBC, HCT, PLT in the last 72 hours. No results for input(s): NA, K, CL, CO2, BUN, CREATININE, GLUCOSE, CALCIUM in the last 72 hours. No results for input(s): LABPT, INR in the last 72 hours.  X-Rays:Dg Spine Portable 1 View  08/05/2015  CLINICAL DATA:  Lumbar decompression L5-S1 EXAM: PORTABLE SPINE - 1 VIEW COMPARISON:  08/05/2015 FINDINGS: Surgical instruments have been advanced over the spinal canal the L5-S1 level. IMPRESSION: L5-S1 disc space localized. Surgical instruments overlying the canal at L5-S1. Electronically Signed   By: Franchot Gallo M.D.   On: 08/05/2015 11:42   Dg Spine Portable 1 View  08/05/2015  CLINICAL DATA:  Lumbar decompression L5-S1 EXAM: PORTABLE SPINE - 1 VIEW COMPARISON:  08/05/2015 FINDINGS: Grade 1 anterior slip L4-5. Surgical  instruments between the spinous processes of L5 and S1. Instrument directed at the lower edge of the L5 lamina. IMPRESSION: L5-S1 localized. Electronically Signed   By: Franchot Gallo M.D.   On: 08/05/2015 10:41   Dg Spine Portable 1 View  08/05/2015  CLINICAL DATA:  Lumbar decompression L5-S1 EXAM: PORTABLE SPINE - 1 VIEW COMPARISON:  02/17/2015 FINDINGS: Grade 1 anterior slip L4-5. Needle directed at the L5 spinous process. Needle directed at the S1 spinous process. IMPRESSION: Lumbar localization in the operating room as above. Electronically Signed   By: Franchot Gallo M.D.   On: 08/05/2015 10:40    EKG: Orders placed or performed during the hospital encounter of 02/12/15  . EKG 12-Lead  . EKG 12-Lead     Hospital Course: Patient was admitted to [facility] and taken to the OR and underwent the above state procedure without complications.  Patient tolerated the procedure well and was later transferred to the recovery room and then to the orthopaedic floor for postoperative care.  They were given PO and IV analgesics for pain control following their surgery.  They were given 24 hours of postoperative antibiotics.   PT was consulted postop to assist with mobility and transfers.  The patient was allowed to be WBAT with therapy and was taught back precautions. Discharge planning was consulted to help with postop disposition and equipment needs.  Patient had a good night on the evening of surgery and started to get up OOB with therapy on day one. Patient was seen in rounds and was ready to go home on day one.  They were given discharge instructions and dressing directions.  They were instructed on when to follow up in the office with Dr. Tonita Cong.   Diet: Regular diet Activity:WBAT; Lspine precautions Follow-up:in 10-14 days Disposition - Home Discharged Condition: good   Discharge Instructions    Call MD / Call 911    Complete by:  As directed   If you experience chest pain or shortness  of breath, CALL 911 and be transported to the hospital emergency room.  If you develope a fever above 101 F, pus (white drainage) or increased drainage or redness at the wound, or calf pain, call your surgeon's office.     Constipation Prevention    Complete by:  As directed   Drink plenty of fluids.  Prune juice may be helpful.  You may use a stool softener, such as Colace (over the counter) 100 mg twice a day.  Use MiraLax (over the counter) for constipation as needed.     Diet - low  sodium heart healthy    Complete by:  As directed      Increase activity slowly as tolerated    Complete by:  As directed             Medication List    TAKE these medications        aspirin EC 81 MG tablet  Take 1 tablet (81 mg total) by mouth daily. Start 4 days post-op     docusate sodium 100 MG capsule  Commonly known as:  COLACE  Take 1 capsule (100 mg total) by mouth 2 (two) times daily as needed for mild constipation.     FLUoxetine 10 MG capsule  Commonly known as:  PROZAC  Take 10 mg by mouth daily.     gabapentin 300 MG capsule  Commonly known as:  NEURONTIN  Take 300 mg by mouth 3 (three) times daily.     LORZONE 750 MG Tabs  Generic drug:  Chlorzoxazone  Take 750 mg by mouth 3 (three) times daily.     methocarbamol 500 MG tablet  Commonly known as:  ROBAXIN  Take 1 tablet (500 mg total) by mouth every 6 (six) hours as needed for muscle spasms.     oxyCODONE-acetaminophen 5-325 MG tablet  Commonly known as:  PERCOCET  Take 1 tablet by mouth every 4 (four) hours as needed for severe pain.     TRIBENZOR 40-5-25 MG Tabs  Generic drug:  Olmesartan-Amlodipine-HCTZ  Take 1 tablet by mouth daily.           Follow-up Information    Follow up with BEANE,JEFFREY C, MD In 2 weeks.   Specialty:  Orthopedic Surgery   Contact information:   [address] [place] [address] [phone]       Signed: Lacie Draft, PA-C Orthopaedic Surgery 08/06/2015,  11:18 AM

## 2015-08-06 NOTE — Progress Notes (Signed)
Subjective: 1 Day Post-Op Procedure(s) (LRB): REVISION MICRO LUMBAR DECOMPRESSION L5 - S1 ON THE RIGHT  (Right) Patient reports pain as moderate.  No significant back pain. C/o pain R leg (hip/thigh) into foot (S1 distribution) similar to pre-op. Some numbness in foot.   Objective: Vital signs in last 24 hours: Temp:  [97.3 F (36.3 C)-98.3 F (36.8 C)] 98.1 F (36.7 C) (04/28 0509) Pulse Rate:  [70-117] 70 (04/28 0509) Resp:  [10-17] 16 (04/28 0509) BP: (107-139)/(44-64) 118/44 mmHg (04/28 0509) SpO2:  [95 %-100 %] 97 % (04/28 0509)  Intake/Output from previous day: 04/27 0701 - 04/28 0700 In: 2085.8 [I.V.:2035.8; IV Piggyback:50] Out: 2000 [Urine:1950; Blood:50] Intake/Output this shift:    No results for input(s): HGB in the last 72 hours. No results for input(s): WBC, RBC, HCT, PLT in the last 72 hours. No results for input(s): NA, K, CL, CO2, BUN, CREATININE, GLUCOSE, CALCIUM in the last 72 hours. No results for input(s): LABPT, INR in the last 72 hours.  Neurologically intact ABD soft Neurovascular intact Sensation intact distally Intact pulses distally Dorsiflexion/Plantar flexion intact Incision: dressing C/D/I and no drainage No cellulitis present Compartment soft no sign of DVT  Assessment/Plan: 1 Day Post-Op Procedure(s) (LRB): REVISION MICRO LUMBAR DECOMPRESSION L5 - S1 ON THE RIGHT  (Right) Advance diet Up with therapy D/C IV fluids  Discussed Lspine precautions, dressing instructions Due to void by 1230 today if no void bladder scan and straight cath prn per protocol Plan D/C later today as long as pain remains controlled and voiding without difficulty Will discuss with Dr. Mliss Fritz, [place]. 08/06/2015, 7:59 AM

## 2016-07-10 HISTORY — PX: COLONOSCOPY: SHX174

## 2017-01-22 ENCOUNTER — Encounter (INDEPENDENT_AMBULATORY_CARE_PROVIDER_SITE_OTHER): Payer: Self-pay

## 2017-01-22 ENCOUNTER — Encounter: Payer: Self-pay | Admitting: Neurology

## 2017-01-22 ENCOUNTER — Ambulatory Visit (INDEPENDENT_AMBULATORY_CARE_PROVIDER_SITE_OTHER): Payer: BC Managed Care – PPO | Admitting: Neurology

## 2017-01-22 VITALS — BP 124/69 | HR 74 | Ht 65.5 in | Wt 179.2 lb

## 2017-01-22 DIAGNOSIS — I639 Cerebral infarction, unspecified: Secondary | ICD-10-CM | POA: Insufficient documentation

## 2017-01-22 DIAGNOSIS — I10 Essential (primary) hypertension: Secondary | ICD-10-CM

## 2017-01-22 DIAGNOSIS — E785 Hyperlipidemia, unspecified: Secondary | ICD-10-CM | POA: Diagnosis not present

## 2017-01-22 DIAGNOSIS — G43109 Migraine with aura, not intractable, without status migrainosus: Secondary | ICD-10-CM

## 2017-01-22 DIAGNOSIS — I63313 Cerebral infarction due to thrombosis of bilateral middle cerebral arteries: Secondary | ICD-10-CM | POA: Diagnosis not present

## 2017-01-22 HISTORY — DX: Hyperlipidemia, unspecified: E78.5

## 2017-01-22 HISTORY — DX: Cerebral infarction, unspecified: I63.9

## 2017-01-22 HISTORY — DX: Migraine with aura, not intractable, without status migrainosus: G43.109

## 2017-01-22 HISTORY — DX: Essential (primary) hypertension: I10

## 2017-01-22 MED ORDER — TOPIRAMATE 25 MG PO TABS: ORAL_TABLET | ORAL | 3 refills | Status: DC

## 2017-01-22 NOTE — Progress Notes (Signed)
NEUROLOGY CLINIC NEW PATIENT NOTE  NAME: Etter Royall DOB: April 24, 1960 REFERRING PHYSICIAN: Raelyn Number, MD  I saw [name] as a new consult in the neurovascular clinic today regarding  Chief Complaint  Patient presents with  . New Patient (Initial Visit)  .  HPI: Courtney Bellizzi is a 56 y.o. female with PMH of HTN, HLD, migraine and anxiety who presents as a new patient for a stroke and migraine.   Pt stated that she started to have migraine headache since her 53s. The HA usually preceded by sudden left eye vision loss, and right eye scotoma and blurry vision and sometimes orogressed to vision loss, bilateral hands and arms tingling. Aura lasting about 10-15 minutes and followed by severe headache on the left side, with photophobia, phonophobia, nausea, lasting 1-3 days. Headache happens 1-2 times per months, last 2 times happened on 12/05/16 and 12/14/16. No triggers identified so far. She is on ibuprofen and Relpax in the past. No preventive medications.  She had about episode in 07/2016, went to see Dr. 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 Was told to avoid Relpax. Planned to do MRI, however not done. Dr. Teryl Lucy later left office. Patient recently follow-up with PCP Dr. Daine Gip on 12/07/16, had MRI brain showed left BG/CR, left thalamic and right BG lacunar infarcts. She was referred here for further evaluation. She is on aspirin and Lipitor.  She had a history of hypertension, currently on Olmesartan, amlodipine and HCTZ. Has history of HLD, currently on Lipitor. Denies smoking, alcohol or illicit drugs  Past Medical History:  Diagnosis Date  . Anxiety   . Arthritis    both index fingers   . Cancer ([place])    hx of precancerous lesions on back - removed   . Family history of adverse reaction to anesthesia    mother who is deceased had hx of N/V   . Headache    hx of occasional migraine   . Hypertension   . Kidney stones     Past Surgical History:  Procedure Laterality Date  . LITHOTRIPSY    . LUMBAR LAMINECTOMY/DECOMPRESSION MICRODISCECTOMY Bilateral 02/17/2015   Procedure: MICRO LUMBAR DECOMPRESSION L5 - S1 ON RIGHT POSSIBLE LEFT;  Surgeon: Susa Day, MD;  Location: [facility];  Service: Orthopedics;  Laterality: Bilateral;  . LUMBAR LAMINECTOMY/DECOMPRESSION MICRODISCECTOMY Right 08/05/2015   Procedure: REVISION MICRO LUMBAR DECOMPRESSION L5 - S1 ON THE RIGHT ;  Surgeon: Susa Day, MD;  Location: [facility];  Service: Orthopedics;  Laterality: Right;  . TUBAL LIGATION     Family History  Problem Relation Age of Onset  . Cancer Mother   . Cancer Father    Current Outpatient Prescriptions  Medication Sig Dispense Refill  . aspirin EC 81 MG tablet Take 1 tablet (81 mg total) by mouth daily. Start 4 days post-op    . atorvastatin (LIPITOR) 40 MG tablet Take 40 mg by mouth daily.    . cloNIDine (CATAPRES) 0.1 MG tablet Take 1 tablet by mouth daily.  2  . eletriptan (RELPAX) 40 MG tablet Take 1 tablet by mouth daily as needed.    Marland Kitchen FLUoxetine (PROZAC) 10 MG capsule Take 10 mg by mouth daily.  2  . gabapentin (NEURONTIN) 300 MG capsule Take 300 mg by mouth 3 (three) times daily.    Marland Kitchen LORZONE 750 MG TABS Take 750 mg by mouth 4 (four) times daily. 1/2 - 1 tablet QID PRN    .  TRIBENZOR 40-5-25 MG TABS Take 1 tablet by mouth daily.  2  . topiramate (TOPAMAX) 25 MG tablet Take 25mg  nightly for 5 days and then bid x 5 days and then 50mg  bid after 120 tablet 3   No current facility-administered medications for this visit.    No Known Allergies Social History   Social History  . Marital status: Single    Spouse name: N/A  . Number of children: N/A  . Years of education: N/A   Occupational History  . Not on file.   Social History Main Topics  . Smoking status: Current Every Day Smoker    Packs/day: 0.50    Types: Cigarettes  . Smokeless tobacco: Never Used  . Alcohol use No  . Drug use: No  . Sexual  activity: Not on file   Other Topics Concern  . Not on file   Social History Narrative   Patient lives at home with her son.    Review of Systems Full 14 system review of systems performed and notable only for those listed, all others are neg:  Constitutional:   Cardiovascular:  Ear/Nose/Throat:  Spinning sensation Skin:  Eyes:  Blurry vision, loss of vision, eye pain Respiratory:   Gastroitestinal:   Genitourinary:  Hematology/Lymphatic:   Endocrine:  Musculoskeletal:   Allergy/Immunology:   Neurological:  Headache, numbness, weakness, slurred speech, dizziness Psychiatric: Anxiety Sleep:    Physical Exam  Vitals:   01/22/17 0947  BP: 124/69  Pulse: 74    General - Well nourished, well developed, in no apparent distress.  Ophthalmologic - Sharp disc margins OU.  Cardiovascular - Regular rate and rhythm with no murmur. Carotid pulses were 2+ without bruits .   Neck - supple, no nuchal rigidity .  Mental Status -  Level of arousal and orientation to time, place, and person were intact. Language including expression, naming, repetition, comprehension, reading, and writing was assessed and found intact. Attention span and concentration were normal. Recent and remote memory were intact. Fund of Knowledge was assessed and was intact.  Cranial Nerves II - XII - II - Visual field intact OU. III, IV, VI - Extraocular movements intact. V - Facial sensation intact bilaterally. VII - Facial movement intact bilaterally. VIII - Hearing & vestibular intact bilaterally. X - Palate elevates symmetrically. XI - Chin turning & shoulder shrug intact bilaterally. XII - Tongue protrusion intact.  Motor Strength - The patient's strength was normal in all extremities and pronator drift was absent.  Bulk was normal and fasciculations were absent.   Motor Tone - Muscle tone was assessed at the neck and appendages and was normal.  Reflexes - The patient's reflexes were normal in  all extremities and she had no pathological reflexes.  Sensory - Light touch, temperature/pinprick, vibration and proprioception, and Romberg testing were assessed and were normal.    Coordination - The patient had normal movements in the hands and feet with no ataxia or dysmetria.  Tremor was absent.  Gait and Station - The patient's transfers, posture, gait, station, and turns were observed as normal.   Imaging  I have personally reviewed the radiological images below and agree with the radiology interpretations.  CT head 07/27/16 No acute finding, remote left lateral lenticulostriate infarct and right putamen lacunar infarct  MRI brain 12/18/16 Old lacunar infarcts in the left thalamus and BG.  Lab Review none    Assessment and Plan:   In summary, Omya Winfield is a 56 y.o. female with PMH of  HTN, HLD, migraine and anxiety who presents as a new patient for a stroke and migraine. 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 Not on preventive medication yet. 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 MRI brain showed left BG/CR, left thalamic and right BG lacunar infarcts. She is on aspirin and Lipitor.  She has risk factor for stroke including migraine without aura, hypertension and hyperlipidemia. Also high incidence of PFO in migraine without aura patient. We will continue finish off stroke workup including TCD bubble study, TEE and CTA head and neck. Will start preventive migraine medications with Topamax.  - continue ASA and lipitor for stroke prevention - check lipid panel and A1c today  - will do CTA head and neck and TTE  - will do TCD bubble study to rule out PFO - will start topamax 25mg  nightly for 5 days and then twice a day for 5 days and then 50mg  twice a day  - Ok to use ibuprofen or tylenol for acute headache treatment  - headache  dairy at home - Follow up with your primary care physician for stroke risk factor modification. Recommend maintain blood pressure goal <130/80, diabetes with hemoglobin A1c goal below 7.0% and lipids with LDL cholesterol goal below 70 mg/dL.  - healthy diet and regular exercise  - follow up in 3 months.   I recommend aggressive blood pressure control with a goal <130/80 mm Hg.  Lipids should be managed intensively, with a goal LDL < 70 mg/dL.  I encouraged the patient to discuss these important issues with her primary care physician.  I counseled the patient on measures to reduce stroke risk, including the importance of medication compliance, risk factor control, exercise, healthy diet, and avoidance of smoking.  I reviewed stroke warning signs and symptoms and appropriate actions to take if such occurs.   Thank you very much for the opportunity to participate in the care of this patient.  Please do not hesitate to call if any questions or concerns arise.  Orders Placed This Encounter  Procedures  . CT ANGIO NECK W OR WO CONTRAST    Standing Status:   Future    Standing Expiration Date:   03/24/2018    Order Specific Question:   If indicated for the ordered procedure, I authorize the administration of contrast media per Radiology protocol    Answer:   Yes    Order Specific Question:   Is patient pregnant?    Answer:   No    Order Specific Question:   Preferred imaging location?    Answer:   [facility]    Order Specific Question:   Radiology Contrast Protocol - do NOT remove file path    Answer:   \\[email or web address]\CTProtocols.pdf  . CT ANGIO HEAD W OR WO CONTRAST    Standing Status:   Future    Standing Expiration Date:   03/24/2018    Order Specific Question:   If indicated for the ordered procedure, I authorize the administration of contrast media per Radiology protocol    Answer:   Yes    Order Specific Question:   Is patient pregnant?    Answer:   No    Order  Specific Question:   Preferred imaging location?    Answer:   [facility]    Order Specific Question:   Radiology Contrast Protocol -  do NOT remove file path    Answer:   \\[email or web address]\Radiant\CTProtocols.pdf  . Basic metabolic panel  . CBC (no diff)  . Hemoglobin A1c  . Lipid panel  . ECHOCARDIOGRAM COMPLETE    Standing Status:   Future    Standing Expiration Date:   04/23/2018    Order Specific Question:   Where should this test be performed    Answer:   [facility]    Order Specific Question:   Does the patient weigh less than or greater than 250 lbs?    Answer:   Patient weighs less than 250 lbs    Order Specific Question:   Complete or Limited study?    Answer:   Complete    Order Specific Question:   With Image Enhancing Agent or without Image Enhancing Agent?    Answer:   With Image Enhancing Agent    Order Specific Question:   Reason for exam-Echo    Answer:   Stroke  434.91 / I163.9    Meds ordered this encounter  Medications  . eletriptan (RELPAX) 40 MG tablet    Sig: Take 1 tablet by mouth daily as needed.  Marland Kitchen atorvastatin (LIPITOR) 40 MG tablet    Sig: Take 40 mg by mouth daily.  . cloNIDine (CATAPRES) 0.1 MG tablet    Sig: Take 1 tablet by mouth daily.    Refill:  2  . topiramate (TOPAMAX) 25 MG tablet    Sig: Take 25mg  nightly for 5 days and then bid x 5 days and then 50mg  bid after    Dispense:  120 tablet    Refill:  3    Patient Instructions  - continue ASA and lipitor for stroke prevention - check Blood draw today  - will do CT vessel study to evaluate  - will do heart ultrasound - will do TCD bubble study to rule out hole in heart  - will start topamax 25mg  nightly for 5 days and then twice a day for 5 days and then 50mg  twice a day  - you still use ibuprofen or tylenol for acute headache treatment  - headache dairy at home - Follow up with your primary care physician for stroke risk factor modification. Recommend  maintain blood pressure goal <130/80, diabetes with hemoglobin A1c goal below 7.0% and lipids with LDL cholesterol goal below 70 mg/dL.  - healthy diet and regular exercise  - follow up in 3 months.    Rosalin Hawking, MD PhD [facility] [address], [place], [place] [address] [phone]

## 2017-01-22 NOTE — Patient Instructions (Signed)
-   continue ASA and lipitor for stroke prevention - check Blood draw today  - will do CT vessel study to evaluate  - will do heart ultrasound - will do TCD bubble study to rule out hole in heart  - will start topamax 25mg  nightly for 5 days and then twice a day for 5 days and then 50mg  twice a day  - you still use ibuprofen or tylenol for acute headache treatment  - headache dairy at home - Follow up with your primary care physician for stroke risk factor modification. Recommend maintain blood pressure goal <130/80, diabetes with hemoglobin A1c goal below 7.0% and lipids with LDL cholesterol goal below 70 mg/dL.  - healthy diet and regular exercise  - follow up in 3 months.

## 2017-01-23 LAB — BASIC METABOLIC PANEL
BUN / CREAT RATIO: 15 (ref 9–23)
BUN: 10 mg/dL (ref 6–24)
CHLORIDE: 100 mmol/L (ref 96–106)
CO2: 25 mmol/L (ref 20–29)
CREATININE: 0.68 mg/dL (ref 0.57–1.00)
Calcium: 10.7 mg/dL — ABNORMAL HIGH (ref 8.7–10.2)
GFR calc Af Amer: 114 mL/min/{1.73_m2} (ref >59)
GFR calc non Af Amer: 99 mL/min/{1.73_m2} (ref >59)
GLUCOSE: 87 mg/dL (ref 65–99)
Potassium: 4.3 mmol/L (ref 3.5–5.2)
SODIUM: 142 mmol/L (ref 134–144)

## 2017-01-23 LAB — LIPID PANEL
Chol/HDL Ratio: 4.6 ratio — ABNORMAL HIGH (ref 0.0–4.4)
Cholesterol, Total: 174 mg/dL (ref 100–199)
HDL: 38 mg/dL — ABNORMAL LOW (ref >39)
LDL CALC: 88 mg/dL (ref 0–99)
Triglycerides: 242 mg/dL — ABNORMAL HIGH (ref 0–149)
VLDL CHOLESTEROL CAL: 48 mg/dL — AB (ref 5–40)

## 2017-01-23 LAB — CBC
HEMATOCRIT: 46 % (ref 34.0–46.6)
Hemoglobin: 15.4 g/dL (ref 11.1–15.9)
MCH: 31.8 pg (ref 26.6–33.0)
MCHC: 33.5 g/dL (ref 31.5–35.7)
MCV: 95 fL (ref 79–97)
Platelets: 308 10*3/uL (ref 150–379)
RBC: 4.84 x10E6/uL (ref 3.77–5.28)
RDW: 13.3 % (ref 12.3–15.4)
WBC: 7.9 10*3/uL (ref 3.4–10.8)

## 2017-01-23 LAB — HEMOGLOBIN A1C
Est. average glucose Bld gHb Est-mCnc: 120 mg/dL
Hgb A1c MFr Bld: 5.8 % — ABNORMAL HIGH (ref 4.8–5.6)

## 2017-01-25 ENCOUNTER — Telehealth: Payer: Self-pay | Admitting: *Deleted

## 2017-01-25 NOTE — Telephone Encounter (Signed)
LMVM home (ok per DPR) that her lab results back (most are unremarkable, BS was normal, cholesterol was bad.  (continue current treatment). She may call back asking for numbers of her lipid panel and if she does will be glad to give them to her.

## 2017-01-25 NOTE — Telephone Encounter (Signed)
-----   Message from Rosalin Hawking, MD sent at 01/23/2017  5:36 PM EDT ----- Could you please let the patient know that the blood test done recently in our office was unremarkable, normal sugar level and bad cholesterol. Please continue current treatment. Thanks.  Rosalin Hawking, MD PhD Stroke Neurology 01/23/2017 5:35 PM

## 2017-01-29 ENCOUNTER — Other Ambulatory Visit: Payer: Self-pay | Admitting: Neurology

## 2017-01-29 ENCOUNTER — Ambulatory Visit
Admission: RE | Admit: 2017-01-29 | Discharge: 2017-01-29 | Disposition: A | Discharge status: Home / Self Care | Payer: BC Managed Care – PPO | Source: Ambulatory Visit | Attending: Neurology | Admitting: Neurology

## 2017-01-29 DIAGNOSIS — I63313 Cerebral infarction due to thrombosis of bilateral middle cerebral arteries: Secondary | ICD-10-CM

## 2017-01-29 DIAGNOSIS — E042 Nontoxic multinodular goiter: Secondary | ICD-10-CM | POA: Insufficient documentation

## 2017-01-29 HISTORY — DX: Nontoxic multinodular goiter: E04.2

## 2017-01-29 MED ORDER — IOPAMIDOL (ISOVUE-370) INJECTION 76%
75.0000 mL | Freq: Once | INTRAVENOUS | Status: AC | PRN
  Administered 2017-01-29: 75 mL via INTRAVENOUS

## 2017-01-30 ENCOUNTER — Ambulatory Visit (HOSPITAL_COMMUNITY): Payer: BC Managed Care – PPO | Attending: Cardiology

## 2017-01-30 ENCOUNTER — Telehealth: Payer: Self-pay

## 2017-01-30 ENCOUNTER — Other Ambulatory Visit: Payer: Self-pay

## 2017-01-30 DIAGNOSIS — E785 Hyperlipidemia, unspecified: Secondary | ICD-10-CM | POA: Diagnosis not present

## 2017-01-30 DIAGNOSIS — I63313 Cerebral infarction due to thrombosis of bilateral middle cerebral arteries: Secondary | ICD-10-CM | POA: Insufficient documentation

## 2017-01-30 DIAGNOSIS — F17200 Nicotine dependence, unspecified, uncomplicated: Secondary | ICD-10-CM | POA: Insufficient documentation

## 2017-01-30 DIAGNOSIS — I10 Essential (primary) hypertension: Secondary | ICD-10-CM | POA: Diagnosis not present

## 2017-01-30 DIAGNOSIS — I071 Rheumatic tricuspid insufficiency: Secondary | ICD-10-CM | POA: Insufficient documentation

## 2017-01-30 NOTE — Telephone Encounter (Signed)
Rn call patient that her Ct head,and neck vessel test done today was unremarkable, no vessel narrowing, no need any intervention. There are bilateral thyroid nodules and we will order thyroid ultrasound for her. She will called for the appointment. Please continue current treatment. Pt verbalized understanding. Rn stated she will get a call about scheduling the thyroid ultrasound. Pt verbalized understanding.

## 2017-01-30 NOTE — Telephone Encounter (Signed)
-----   Message from Rosalin Hawking, MD sent at 01/29/2017  3:04 PM EDT ----- Could you please let the patient know that the CT head and neck vessel test done today was unremarkable, no vessel narrowing, no need any intervention. There are bilateral thyroid nodules and we will order thyroid ultrasound for her. She will called for the appointment. Please continue current treatment. Thanks.  Rosalin Hawking, MD PhD Stroke Neurology 01/29/2017 3:04 PM

## 2017-02-05 ENCOUNTER — Telehealth: Payer: Self-pay

## 2017-02-05 NOTE — Telephone Encounter (Signed)
Left vm for patient to call back about echocardiogram results. ------ 

## 2017-02-05 NOTE — Telephone Encounter (Signed)
-----   Message from Rosalin Hawking, MD sent at 01/30/2017  9:59 PM EDT ----- Could you please let the patient know that the heart ultrasound test done recently was unremarkable. Please continue current treatment. Thanks.  Rosalin Hawking, MD PhD Stroke Neurology 01/30/2017 9:59 PM

## 2017-02-12 ENCOUNTER — Telehealth: Payer: Self-pay | Admitting: Neurology

## 2017-02-12 NOTE — Telephone Encounter (Signed)
Left 2nd vm for patient to call back about echocardiogram results.

## 2017-02-12 NOTE — Telephone Encounter (Signed)
Update . Zacarias Pontes has called patient  Krystal Ellis 3 and left her messages to call and schedule her doppler. Patient has not returned call yet.   Patient need's to call [phone] to schedule.

## 2017-04-30 ENCOUNTER — Ambulatory Visit: Payer: BC Managed Care – PPO | Admitting: Neurology

## 2017-05-01 ENCOUNTER — Encounter: Payer: Self-pay | Admitting: Neurology

## 2017-08-01 ENCOUNTER — Telehealth: Payer: Self-pay | Admitting: Neurology

## 2017-08-01 NOTE — Telephone Encounter (Signed)
Called and spoke to patient .  Patient is having her doppler with bubbles on 08/03/2017 arrive at 12:30 for 1:00 apt . Location [address] check in at admitting.   . [phone].   US Thyroid Patient will call [facility] at [phone] to reschedule .   I relayed Dr. Erlinda Hong is out of the office this week and he will address letter when he return's next week. Patient understood all details.

## 2017-08-01 NOTE — Telephone Encounter (Signed)
She was seen as new pt in 01/2017. MRI showed old stroke but no new stroke. So far stroke work up neg. Her exam was normal at that time. I do not think she needs to be restricted from driving at this time. I can write a letter next week when I am in office.   However, her TCD bubble study and thyroid ultrasound have not done yet. Please let her call [phone] to reschedule TCD bubble study and call [facility] for thyroid ultrasound. Thanks.   Rosalin Hawking, MD PhD Stroke Neurology 08/01/2017 4:14 PM

## 2017-08-01 NOTE — Telephone Encounter (Signed)
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  Pt is asking for a note of some kind stating that she is able to drive.  Please call

## 2017-08-02 NOTE — Telephone Encounter (Signed)
Patient has been schedule for vas carotid doppler per Hinton Dyer in referrals.Also pt will call GI to r/s thryoidi ultrasound.

## 2017-08-03 ENCOUNTER — Ambulatory Visit (HOSPITAL_COMMUNITY)
Admission: RE | Admit: 2017-08-03 | Discharge: 2017-08-03 | Disposition: A | Discharge status: Home / Self Care | Payer: BC Managed Care – PPO | Source: Ambulatory Visit | Attending: Neurology | Admitting: Neurology

## 2017-08-03 ENCOUNTER — Encounter (HOSPITAL_COMMUNITY): Payer: Self-pay

## 2017-08-03 ENCOUNTER — Other Ambulatory Visit: Payer: Self-pay | Admitting: Neurology

## 2017-08-03 ENCOUNTER — Ambulatory Visit (HOSPITAL_COMMUNITY): Payer: BC Managed Care – PPO

## 2017-08-03 DIAGNOSIS — I63313 Cerebral infarction due to thrombosis of bilateral middle cerebral arteries: Secondary | ICD-10-CM

## 2017-08-06 ENCOUNTER — Encounter: Payer: Self-pay | Admitting: Neurology

## 2017-08-06 NOTE — Telephone Encounter (Signed)
Hi, Katrina:  I have done the letter and place on your desk. Thanks.   Rosalin Hawking, MD PhD Stroke Neurology 08/06/2017 5:58 PM

## 2017-08-07 ENCOUNTER — Telehealth: Payer: Self-pay

## 2017-08-07 NOTE — Telephone Encounter (Signed)
Letter sent to patients listed home address per her request. 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

## 2017-08-07 NOTE — Telephone Encounter (Signed)
Letter put in mail to patient.

## 2017-08-10 ENCOUNTER — Ambulatory Visit
Admission: RE | Admit: 2017-08-10 | Discharge: 2017-08-10 | Disposition: A | Discharge status: Home / Self Care | Payer: BC Managed Care – PPO | Source: Ambulatory Visit | Attending: Neurology | Admitting: Neurology

## 2017-08-10 DIAGNOSIS — E042 Nontoxic multinodular goiter: Secondary | ICD-10-CM

## 2017-08-13 ENCOUNTER — Telehealth: Payer: Self-pay

## 2017-08-13 NOTE — Telephone Encounter (Signed)
Pt returned RN's call °

## 2017-08-13 NOTE — Telephone Encounter (Signed)
Left vm for patient to call back about thyroid ultrasound referrals, results sent to PCP.

## 2017-08-13 NOTE — Telephone Encounter (Signed)
-----   Message from Rosalin Hawking, MD sent at 08/10/2017  6:16 PM EDT ----- Could you please let the patient know that the ultrasound of her thyroid test done recently showed multiple thyroid nodules. She needs thyroid biopsy to make sure all of them are benign. I will send her result to her PCP and she needs to call her PCP to schedule the biopsy. Thanks.  Also, please confirm her PCP information so that she can have adequate follow up. Thanks.   Rosalin Hawking, MD PhD Stroke Neurology 08/10/2017 6:16 PM

## 2017-08-13 NOTE — Telephone Encounter (Signed)
Notes recorded by Marval Regal, RN on 08/13/2017 at 9:47 AM EDT Left vm on patients home phone to call back about results. RN tried calling cell phone but it was disconnected.

## 2017-08-14 NOTE — Telephone Encounter (Signed)
Notes recorded by Marval Regal, RN on 08/14/2017 at 4:12 PM EDT RN call patient about her thyroid ultrasound. PT stated she got a call from her PCP because Dr. Erlinda Hong notified him of the results. PT knows she has multiple thyroid nodules that showed up. Pt will be having a thyroid biopsy to make sure they are all benign. THe biopsy is next week. RN stated a copy will be fax to her PCP. PT verbalized understanding. ------

## 2017-08-14 NOTE — Telephone Encounter (Signed)
Ultrasound results fax to Dr. Jannette Fogo. Fax confirmed via fax at [phone].

## 2017-12-06 IMAGING — DX DG SPINE 1V PORT
1 series · 1 of 1 positions shown · non-contrast
Comparison: 08/05/2015

CLINICAL DATA: Lumbar decompression L5-S1

EXAM:
PORTABLE SPINE - 1 VIEW

[ls-spine x-table]
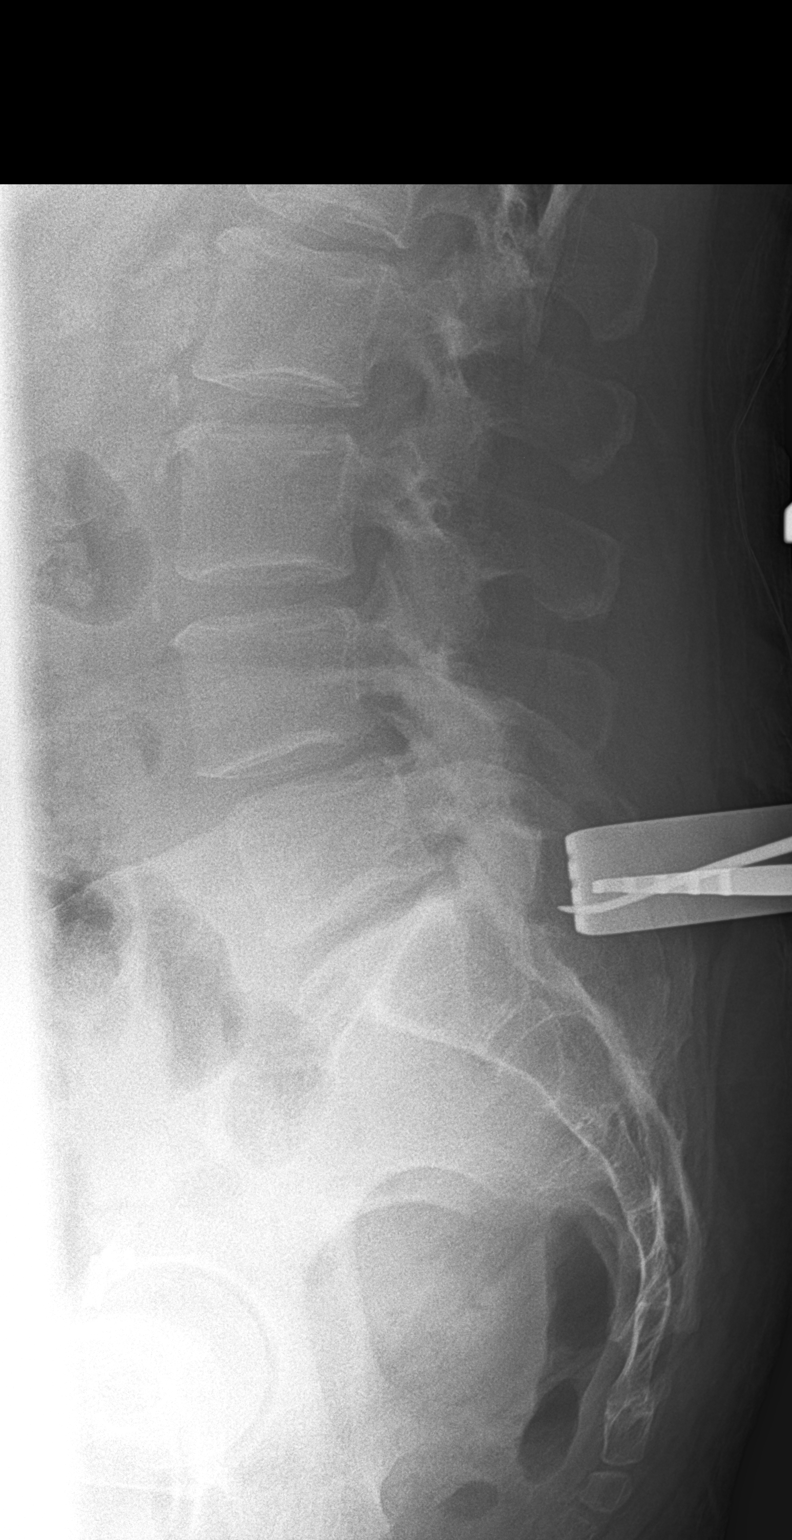

[1 of 1 positions shown; findings below may reference images not displayed]

FINDINGS: Grade 1 anterior slip L4-5.

Surgical instruments between the spinous processes of L5 and S1.
Instrument directed at the lower edge of the L5 lamina.
IMPRESSION: L5-S1 localized.

## 2019-12-23 DIAGNOSIS — R079 Chest pain, unspecified: Secondary | ICD-10-CM | POA: Diagnosis not present

## 2019-12-24 ENCOUNTER — Encounter: Payer: Self-pay | Admitting: Cardiology

## 2019-12-24 DIAGNOSIS — Z720 Tobacco use: Secondary | ICD-10-CM

## 2019-12-24 DIAGNOSIS — I10 Essential (primary) hypertension: Secondary | ICD-10-CM | POA: Diagnosis not present

## 2019-12-24 DIAGNOSIS — R079 Chest pain, unspecified: Secondary | ICD-10-CM | POA: Diagnosis not present

## 2019-12-24 DIAGNOSIS — R0602 Shortness of breath: Secondary | ICD-10-CM | POA: Diagnosis not present

## 2019-12-24 DIAGNOSIS — E785 Hyperlipidemia, unspecified: Secondary | ICD-10-CM | POA: Diagnosis not present

## 2020-01-01 ENCOUNTER — Telehealth: Payer: Self-pay | Admitting: Cardiology

## 2020-01-01 NOTE — Telephone Encounter (Signed)
Patient is scheduled for a hospital follow up with Dr. Bettina Gavia. She was seen in [facility] by Dr. Bettina Gavia.

## 2020-01-05 DIAGNOSIS — M1990 Unspecified osteoarthritis, unspecified site: Secondary | ICD-10-CM | POA: Insufficient documentation

## 2020-01-05 DIAGNOSIS — C801 Malignant (primary) neoplasm, unspecified: Secondary | ICD-10-CM | POA: Insufficient documentation

## 2020-01-05 DIAGNOSIS — Z8489 Family history of other specified conditions: Secondary | ICD-10-CM | POA: Insufficient documentation

## 2020-01-05 DIAGNOSIS — F419 Anxiety disorder, unspecified: Secondary | ICD-10-CM | POA: Insufficient documentation

## 2020-01-05 DIAGNOSIS — N200 Calculus of kidney: Secondary | ICD-10-CM | POA: Insufficient documentation

## 2020-01-05 DIAGNOSIS — I10 Essential (primary) hypertension: Secondary | ICD-10-CM | POA: Insufficient documentation

## 2020-01-05 DIAGNOSIS — R519 Headache, unspecified: Secondary | ICD-10-CM | POA: Insufficient documentation

## 2020-01-05 NOTE — Progress Notes (Signed)
Cardiology Office Note:    Date:  01/06/2020   ID:  Krystal Ellis, DOB [birth year], MRN [id number]  PCP:  Krystal Nasuti, MD  Cardiologist:  Krystal More, MD    Referring MD: Krystal Nasuti, MD    ASSESSMENT:    No diagnosis found. PLAN:    In order of problems listed above:  1. Unfortunately she had a false finding of left ventricular aneurysm on a noncardiac gated CT of the chest with normal myocardial perfusion study and echocardiogram.  I apologize for the failure of communication she needs no further cardiac evaluation 2. Hyper tension stable BP at target repeat by me 124/76 continue current antihypertensives 3. Hyperlipidemia stable continue statin 4. She was counseled  regarding smoking cessation   Next appointment: As needed   Medication Adjustments/Labs and Tests Ordered: Current medicines are reviewed at length with the patient today.  Concerns regarding medicines are outlined above.  No orders of the defined types were placed in this encounter.  No orders of the defined types were placed in this encounter.   Chief Complaint  Patient presents with  . Follow-up    Seen at [facility] she had a normal echocardiogram normal myocardial perfusion study.  On a nongated chest CT there was a suggestion of subtle findings of a left ventricular aneurysm.  In my opinion this cannot be noticed with any accuracy on a noncardiac CT scan.    History of Present Illness:    Krystal Ellis is a 59 y.o. female with a hx of COPD with acute exacerbation hospitalization 12/24/2019.  She had nonanginal chest pain a nongated CT was read as possible left ventricular aneurysm and cardiac evaluation showed no findings of CAD aneurysm or cardio myopathy.  Last seen as an inpatient 12/24/2019  . 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  I reviewed  the testing with her and she understands she has no evidence of coronary disease cardiomyopathy or aneurysm.  I apologized to her for the failure of communication.  Fortunately she is trying to stop smoking she is improved no longer short of breath no chest pain palpitation or syncope.  She complains bitterly of pain in the right hand loss of strength appears to have severe carpal tunnel syndrome and she has arrangements to be seen by orthopedic surgery in [place].  She will need nerve conduction velocities I gave her handwritten note. 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  Her history includes hypertension hyper lipidemia.  Serial cardiac troponins were undetectable CBC showed elevated white count 17,000 hemoglobin normal 13.5 creatinine normal potassium 4.3.  EKG was described as showing sinus tachycardia CTA of the chest was read as subtle left ventricular dilation with consideration of the left ventricular aneurysm.  Subsequent echocardiogram showed normal left ventricular function no findings of myocardial infarction or aneurysm.  All this was preceded by an upper respiratory tract infection with several Covid normal test performed.  CT report of the chest showed no coronary artery calcification Past Medical History:  Diagnosis Date  . Anxiety   .  Arthritis    both index fingers   . Cancer ([place])    hx of precancerous lesions on back - removed   . Essential hypertension 01/22/2017  . Family history of adverse reaction to anesthesia    mother who is deceased had hx of N/V   . Headache    hx of occasional migraine   . HNP (herniated nucleus pulposus), lumbar  08/05/2015  . Hyperlipidemia 01/22/2017  . Hypertension   . Kidney stones   . Migraine with aura 01/22/2017  . Multiple thyroid nodules 01/29/2017  . Spinal stenosis of lumbar region 02/17/2015  . 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 LITHOTRIPSY    . LUMBAR LAMINECTOMY/DECOMPRESSION MICRODISCECTOMY Bilateral 02/17/2015   Procedure: MICRO LUMBAR DECOMPRESSION L5 - S1 ON RIGHT POSSIBLE LEFT;  Surgeon: Susa Day, MD;  Location: [facility];  Service: Orthopedics;  Laterality: Bilateral;  . LUMBAR LAMINECTOMY/DECOMPRESSION MICRODISCECTOMY Right 08/05/2015   Procedure: REVISION MICRO LUMBAR DECOMPRESSION L5 - S1 ON THE RIGHT ;  Surgeon: Susa Day, MD;  Location: [facility];  Service: Orthopedics;  Laterality: Right;  . TUBAL LIGATION      Current Medications: Current Meds  Medication Sig  . albuterol (VENTOLIN HFA) 108 (90 Base) MCG/ACT inhaler 1 puff every 4 (four) hours.  Marland Kitchen aspirin EC 81 MG tablet Take 1 tablet (81 mg total) by mouth daily. Start 4 days post-op  . atorvastatin (LIPITOR) 40 MG tablet Take 40 mg by mouth daily.  . carvedilol (COREG) 3.125 MG tablet Take 3.125 mg by mouth 2 (two) times daily.  . cyclobenzaprine (FLEXERIL) 10 MG tablet Take 10 mg by mouth at bedtime as needed.  . eletriptan (RELPAX) 40 MG tablet Take 1 tablet by mouth daily as needed.  . famotidine (PEPCID) 20 MG tablet Take 20 mg by mouth 2 (two) times daily.  Marland Kitchen FLUoxetine (PROZAC) 10 MG capsule Take 10 mg by mouth daily.  . fluticasone (FLONASE) 50 MCG/ACT nasal spray Place 1 spray into both nostrils daily.  . Fluticasone-Umeclidin-Vilant (TRELEGY ELLIPTA) 100-62.5-25 MCG/INH AEPB Inhale 1 puff into the lungs daily.  Marland Kitchen gabapentin (NEURONTIN) 300 MG capsule Take 300 mg by mouth 3 (three) times daily.  . nicotine (NICODERM CQ - DOSED IN MG/24 HOURS) 21 mg/24hr patch 21 mg daily.  . Omega-3 1000 MG CAPS Take by mouth.  Marland Kitchen Upadacitinib ER (RINVOQ) 15 MG TB24  Take 15 mg by mouth daily.  . Vitamin D, Ergocalciferol, (DRISDOL) 1.25 MG (50000 UNIT) CAPS capsule Take 50,000 Units by mouth 3 (three) times a week.     Allergies:   Patient has no known allergies.   Social History   Socioeconomic History  . Marital status: Single    Spouse name: Not on file  . Number of children: Not on file  . Years of education: Not on file  . Highest education level: Not on file  Occupational History  . Not on file  Tobacco Use  . Smoking status: Current Every Day Smoker    Packs/day: 0.50    Types: Cigarettes  . Smokeless tobacco: Never Used  Substance and Sexual Activity  . Alcohol use: No  . Drug use: No  . Sexual activity: Not on file  Other Topics Concern  . Not on file  Social History Narrative   Patient lives at home with her son.   Social Determinants of Health   Financial Resource Strain:   . Difficulty of Paying Living Expenses: Not  on file  Food Insecurity:   . Worried About Charity fundraiser in the Last Year: Not on file  . Ran Out of Food in the Last Year: Not on file  Transportation Needs:   . Lack of Transportation (Medical): Not on file  . Lack of Transportation (Non-Medical): Not on file  Physical Activity:   . Days of Exercise per Week: Not on file  . Minutes of Exercise per Session: Not on file  Stress:   . Feeling of Stress : Not on file  Social Connections:   . Frequency of Communication with Friends and Family: Not on file  . Frequency of Social Gatherings with Friends and Family: Not on file  . Attends Religious Services: Not on file  . Active Member of Clubs or Organizations: Not on file  . Attends Archivist Meetings: Not on file  . Marital Status: Not on file     Family History: The patient's family history includes Cancer in her father and mother. ROS:   Please see the history of present illness.    All other systems reviewed and are negative.  EKGs/Labs/Other Studies Reviewed:    The  following studies were reviewed today:    Recent Labs: No results found for requested labs within last 8760 hours.  Recent Lipid Panel    Component Value Date/Time   CHOL 174 01/22/2017 1049   TRIG 242 (H) 01/22/2017 1049   HDL 38 (L) 01/22/2017 1049   CHOLHDL 4.6 (H) 01/22/2017 1049   LDLCALC 88 01/22/2017 1049    Physical Exam:    VS:  BP (!) 149/80   Pulse 66   Ht 5' 5.5" (1.664 m)   Wt 163 lb (73.9 kg)   SpO2 97%   BMI 26.71 kg/m     Wt Readings from Last 3 Encounters:  01/06/20 163 lb (73.9 kg)  01/22/17 179 lb 3.2 oz (81.3 kg)  08/05/15 185 lb (83.9 kg)     GEN:  Well nourished, well developed in no acute distress HEENT: Normal NECK: No JVD; No carotid bruits LYMPHATICS: No lymphadenopathy CARDIAC: RRR, no murmurs, rubs, gallops RESPIRATORY:  Clear to auscultation without rales, wheezing or rhonchi  ABDOMEN: Soft, non-tender, non-distended MUSCULOSKELETAL:  No edema; No deformity  SKIN: Warm and dry NEUROLOGIC:  Alert and oriented x 3 PSYCHIATRIC:  Normal affect  He has muscle wasting in her right hand negative Tinel's sign   Signed, Krystal More, MD  01/06/2020 8:38 AM    [place]

## 2020-01-06 ENCOUNTER — Ambulatory Visit: Payer: BC Managed Care – PPO | Admitting: Cardiology

## 2020-01-06 ENCOUNTER — Other Ambulatory Visit: Payer: Self-pay

## 2020-01-06 ENCOUNTER — Encounter: Payer: Self-pay | Admitting: Cardiology

## 2020-01-06 VITALS — BP 149/80 | HR 66 | Ht 65.5 in | Wt 163.0 lb

## 2020-01-06 DIAGNOSIS — I10 Essential (primary) hypertension: Secondary | ICD-10-CM | POA: Diagnosis not present

## 2020-01-06 DIAGNOSIS — E782 Mixed hyperlipidemia: Secondary | ICD-10-CM

## 2020-01-06 DIAGNOSIS — Z716 Tobacco abuse counseling: Secondary | ICD-10-CM

## 2020-01-06 DIAGNOSIS — R9389 Abnormal findings on diagnostic imaging of other specified body structures: Secondary | ICD-10-CM | POA: Diagnosis not present

## 2020-01-06 NOTE — Patient Instructions (Signed)

## 2021-08-31 ENCOUNTER — Encounter: Payer: Self-pay | Admitting: Gastroenterology

## 2022-01-26 ENCOUNTER — Encounter: Payer: Self-pay | Admitting: Gastroenterology

## 2022-01-26 ENCOUNTER — Ambulatory Visit: Payer: BC Managed Care – PPO | Admitting: Gastroenterology

## 2022-01-26 VITALS — BP 118/72 | HR 54 | Ht 65.0 in | Wt 145.0 lb

## 2022-01-26 DIAGNOSIS — Z860100 Personal history of colon polyps, unspecified: Secondary | ICD-10-CM

## 2022-01-26 NOTE — Progress Notes (Signed)
Chief Complaint: for colon  Referring Provider:  Bonnita Nasuti, MD      ASSESSMENT AND PLAN;   #1. H/O polyps  Plan: -Colonoscopy -Again counseled her to quit smoking.   Discussed risks & benefits of colonoscopy. Risks including rare perforation req laparotomy, bleeding after bx/polypectomy req blood transfusion, rarely missing neoplasms, risks of anesthesia/sedation, rare risk of damage to internal organs. Benefits outweigh the risks. Patient agrees to proceed. All the questions were answered. Pt consents to proceed.   HPI:    Krystal Ellis is a 61 y.o. female  With HTN, HLD, chr bronchitis with continued smoking.  No nausea, vomiting, heartburn, regurgitation, odynophagia or dysphagia.  No significant diarrhea or constipation.  No melena or hematochezia. No unintentional weight loss. No abdominal pain.  rectal bleeding x 1 episode d/t hoids- resolved.  She also got a letter to repeat colon.  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  Past GI procedures Colonoscopy 07/2016 ([facility]) -Colon polyps s/p polypectomy. Bx- TA -Mild sigmoid diverticulosis. -Small internal hemorrhoids  Past Medical History:  Diagnosis Date   Adjustment disorder with depressed mood    Anxiety    Arthritis    both index fingers    Cancer ([place])    hx of precancerous lesions on back - removed    Essential hypertension 01/22/2017   Family history of adverse reaction to anesthesia    mother who is deceased had hx of N/V    Headache    hx of occasional migraine    HNP (herniated nucleus pulposus), lumbar 08/05/2015   Hyperlipidemia 01/22/2017   Hypertension    Kidney stones    Lumbago with sciatica, right side    Migraine with aura 01/22/2017   Multiple thyroid nodules 01/29/2017   Spinal stenosis of lumbar region 02/17/2015   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 Mild sigmoid divericulosis   LITHOTRIPSY     LUMBAR LAMINECTOMY/DECOMPRESSION MICRODISCECTOMY Bilateral 02/17/2015   Procedure: MICRO LUMBAR DECOMPRESSION L5 - S1 ON RIGHT POSSIBLE LEFT;  Surgeon: Susa Day, MD;  [facility];  Service: Orthopedics;  Laterality: Bilateral;   LUMBAR LAMINECTOMY/DECOMPRESSION MICRODISCECTOMY Right 08/05/2015   Procedure: REVISION MICRO LUMBAR DECOMPRESSION L5 - S1 ON THE RIGHT ;  Surgeon: Susa Day, MD;  Location: [facility];  Service: Orthopedics;  Laterality: Right;   TUBAL LIGATION      Family History  Problem Relation Age of Onset   Cancer Mother    Cancer Father     Social History   Tobacco Use   Smoking status: Every Day    Packs/day: 0.50    Types: Cigarettes   Smokeless tobacco: Never  Vaping Use   Vaping Use: Never used  Substance Use Topics   Alcohol use: No   Drug use: No    Current Outpatient Medications  Medication Sig Dispense Refill   albuterol (VENTOLIN HFA) 108 (90 Base) MCG/ACT inhaler 1 puff every 4 (four) hours.     aspirin EC 81 MG tablet Take 1 tablet (81 mg total) by mouth daily. Start 4 days post-op     carvedilol (COREG) 3.125 MG tablet Take 3.125 mg by mouth 2 (two) times daily.     cyclobenzaprine (FLEXERIL) 10 MG tablet Take 10 mg by mouth at bedtime as needed.     eletriptan (RELPAX) 40 MG tablet  Take 1 tablet by mouth daily as needed.     fluticasone (FLONASE) 50 MCG/ACT nasal spray Place 1 spray into both nostrils daily.     Fluticasone-Umeclidin-Vilant (TRELEGY ELLIPTA) 100-62.5-25 MCG/INH AEPB Inhale 1 puff into the lungs daily.     gabapentin (NEURONTIN) 300 MG capsule Take 300 mg by mouth 3 (three) times daily.     Omega-3 1000 MG CAPS Take by mouth.     Upadacitinib ER (RINVOQ) 15 MG TB24 Take 15 mg by mouth daily.     Vitamin D, Ergocalciferol, (DRISDOL) 1.25 MG (50000 UNIT) CAPS capsule Take 50,000 Units by mouth 3 (three) times a week.     atorvastatin (LIPITOR) 40 MG  tablet Take 40 mg by mouth daily. (Patient not taking: Reported on 01/26/2022)     famotidine (PEPCID) 20 MG tablet Take 20 mg by mouth 2 (two) times daily. (Patient not taking: Reported on 01/26/2022)     FLUoxetine (PROZAC) 10 MG capsule Take 10 mg by mouth daily. (Patient not taking: Reported on 01/26/2022)  2   nicotine (NICODERM CQ - DOSED IN MG/24 HOURS) 21 mg/24hr patch 21 mg daily. (Patient not taking: Reported on 01/26/2022)     No current facility-administered medications for this visit.    No Known Allergies  Review of Systems:  Constitutional: Denies fever, chills, diaphoresis, appetite change and fatigue.  HEENT: Denies photophobia, eye pain, redness, hearing loss, ear pain, congestion, sore throat, rhinorrhea, sneezing, mouth sores, neck pain, neck stiffness and tinnitus.   Respiratory: Denies SOB, DOE, cough, chest tightness,  and wheezing.   Cardiovascular: Denies chest pain, palpitations and leg swelling.  Genitourinary: Denies dysuria, urgency, frequency, hematuria, flank pain and difficulty urinating.  Musculoskeletal: Denies myalgias, back pain, joint swelling, arthralgias and gait problem.  Skin: No rash.  Neurological: Denies dizziness, seizures, syncope, weakness, light-headedness, numbness and headaches.  Hematological: Denies adenopathy. Easy bruising, personal or family bleeding history  Psychiatric/Behavioral: has anxiety or depression     Physical Exam:    BP 118/72   Pulse (!) 54   Ht '5\' 5"'$  (1.651 m)   Wt 145 lb (65.8 kg)   BMI 24.13 kg/m  Wt Readings from Last 3 Encounters:  01/26/22 145 lb (65.8 kg)  01/06/20 163 lb (73.9 kg)  01/22/17 179 lb 3.2 oz (81.3 kg)   Constitutional:  Well-developed, in no acute distress. Psychiatric: Normal mood and affect. Behavior is normal. HEENT: Pupils normal.  Conjunctivae are normal. No scleral icterus. Cardiovascular: Normal rate, regular rhythm. No edema Pulmonary/chest: Effort normal and breath sounds  normal. No wheezing, rales or rhonchi. Abdominal: Soft, nondistended. Nontender. Bowel sounds active throughout. There are no masses palpable. No hepatomegaly. Neurological: Alert and oriented to person place and time. Skin: Skin is warm and dry. No rashes noted.       Carmell Austria, MD 01/26/2022, 10:26 AM  Cc: Bonnita Nasuti, MD

## 2022-01-26 NOTE — Patient Instructions (Signed)
_______________________________________________________  If you are age 60 or older, your body mass index should be between 23-30. Your Body mass index is 24.13 kg/m. If this is out of the aforementioned range listed, please consider follow up with your Primary Care Provider.  If you are age 35 or younger, your body mass index should be between 19-25. Your Body mass index is 24.13 kg/m. If this is out of the aformentioned range listed, please consider follow up with your Primary Care Provider.   ________________________________________________________  The [facility] providers would like to encourage you to use [facility] to communicate with providers for non-urgent requests or questions.  Due to long hold times on the telephone, sending your provider a message by [facility] may be a faster and more efficient way to get a response.  Please allow 48 business hours for a response.  Please remember that this is for non-urgent requests.  _______________________________________________________  Krystal Ellis have been scheduled for a colonoscopy. Please follow written instructions given to you at your visit today.  Please pick up your prep supplies at the pharmacy within the next 1-3 days. If you use inhalers (even only as needed), please bring them with you on the day of your procedure.  We have given you samples of the following medication to take: Clenpiq Lot [id number] Exp 03-2023  Thank you,  Dr. Jackquline Denmark

## 2022-02-02 ENCOUNTER — Encounter: Payer: Self-pay | Admitting: Gastroenterology

## 2022-02-02 ENCOUNTER — Ambulatory Visit (AMBULATORY_SURGERY_CENTER): Payer: BC Managed Care – PPO | Admitting: Gastroenterology

## 2022-02-02 VITALS — BP 110/65 | HR 60 | Temp 97.8°F | Resp 12 | Ht 65.0 in | Wt 145.0 lb

## 2022-02-02 DIAGNOSIS — Z09 Encounter for follow-up examination after completed treatment for conditions other than malignant neoplasm: Secondary | ICD-10-CM

## 2022-02-02 DIAGNOSIS — Z86010 Personal history of colonic polyps: Secondary | ICD-10-CM

## 2022-02-02 DIAGNOSIS — D122 Benign neoplasm of ascending colon: Secondary | ICD-10-CM

## 2022-02-02 MED ORDER — SODIUM CHLORIDE 0.9 % IV SOLN: 500.0000 mL | INTRAVENOUS | Status: DC

## 2022-02-02 NOTE — Progress Notes (Signed)
Called to room to assist during endoscopic procedure.  Patient ID and intended procedure confirmed with present staff. Received instructions for my participation in the procedure from the performing physician.  

## 2022-02-02 NOTE — Patient Instructions (Signed)
Read all of the papers given to you by your recovery room nurse.  Try to use benefiber one teaspoon daily in 8 oz of water per Dr Lyndel Safe.  Resume your previous medications.  YOU HAD AN ENDOSCOPIC PROCEDURE TODAY AT [facility]:   Refer to the procedure report that was given to you for any specific questions about what was found during the examination.  If the procedure report does not answer your questions, please call your gastroenterologist to clarify.  If you requested that your care partner not be given the details of your procedure findings, then the procedure report has been included in a sealed envelope for you to review at your convenience later.  YOU SHOULD EXPECT: Some feelings of bloating in the abdomen. Passage of more gas than usual.  Walking can help get rid of the air that was put into your GI tract during the procedure and reduce the bloating. If you had a lower endoscopy (such as a colonoscopy or flexible sigmoidoscopy) you may notice spotting of blood in your stool or on the toilet paper. If you underwent a bowel prep for your procedure, you may not have a normal bowel movement for a few days.  Please Note:  You might notice some irritation and congestion in your nose or some drainage.  This is from the oxygen used during your procedure.  There is no need for concern and it should clear up in a day or so.  SYMPTOMS TO REPORT IMMEDIATELY:  Following lower endoscopy (colonoscopy or flexible sigmoidoscopy):  Excessive amounts of blood in the stool  Significant tenderness or worsening of abdominal pains  Swelling of the abdomen that is new, acute  Fever of 100F or higher   For urgent or emergent issues, a gastroenterologist can be reached at any hour by calling [phone]. Do not use MyChart messaging for urgent concerns.    DIET:  We do recommend a small meal at first, but then you may proceed to your regular diet.  Drink plenty of fluids but you should avoid  alcoholic beverages for 24 hours. Try to increase the fiber in your dit, and drink plenty of water.  ACTIVITY:  You should plan to take it easy for the rest of today and you should NOT DRIVE or use heavy machinery until tomorrow (because of the sedation medicines used during the test).    FOLLOW UP: Our staff will call the number listed on your records the next business day following your procedure.  We will call around 7:15- 8:00 am to check on you and address any questions or concerns that you may have regarding the information given to you following your procedure. If we do not reach you, we will leave a message.     If any biopsies were taken you will be contacted by phone or by letter within the next 1-3 weeks.  Please call us at [phone] if you have not heard about the biopsies in 3 weeks.    SIGNATURES/CONFIDENTIALITY: You and/or your care partner have signed paperwork which will be entered into your electronic medical record.  These signatures attest to the fact that that the information above on your After Visit Summary has been reviewed and is understood.  Full responsibility of the confidentiality of this discharge information lies with you and/or your care-partner.

## 2022-02-02 NOTE — Op Note (Signed)
[place] Patient Name: Krystal Ellis Procedure Date: 02/02/2022 10:02 AM MRN: [id number] Endoscopist: Jackquline Denmark , MD, [id number] Age: 61 Referring MD:  Date of Birth: 07/26/1960 Gender: Female Account #: [id number] Procedure:                Colonoscopy Indications:              High risk colon cancer surveillance: Personal                            history of colonic polyps Medicines:                Monitored Anesthesia Care Procedure:                Pre-Anesthesia Assessment:                           - Prior to the procedure, a History and Physical                            was performed, and patient medications and                            allergies were reviewed. The patient's tolerance of                            previous anesthesia was also reviewed. The risks                            and benefits of the procedure and the sedation                            options and risks were discussed with the patient.                            All questions were answered, and informed consent                            was obtained. Prior Anticoagulants: The patient has                            taken no anticoagulant or antiplatelet agents. ASA                            Grade Assessment: II - A patient with mild systemic                            disease. After reviewing the risks and benefits,                            the patient was deemed in satisfactory condition to                            undergo the procedure.  After obtaining informed consent, the colonoscope                            was passed under direct vision. Throughout the                            procedure, the patient's blood pressure, pulse, and                            oxygen saturations were monitored continuously. The                            Olympus PCF-H190DL ([id number]) Colonoscope was                            introduced through the anus and  advanced to the 2                            cm into the ileum. The colonoscopy was performed                            without difficulty. The patient tolerated the                            procedure well. The quality of the bowel                            preparation was good. The terminal ileum, ileocecal                            valve, appendiceal orifice, and rectum were                            photographed. Scope In: 10:17:41 AM Scope Out: 10:39:22 AM Scope Withdrawal Time: 0 hours 15 minutes 56 seconds  Total Procedure Duration: 0 hours 21 minutes 41 seconds  Findings:                 A 6 mm polyp was found in the proximal ascending                            colon. The polyp was sessile. The polyp was removed                            with a cold snare. Resection and retrieval were                            complete.                           Multiple medium-mouthed diverticula were found in                            the sigmoid colon and fre rare ascending colon.  Non-bleeding internal hemorrhoids were found during                            retroflexion and during perianal exam. The                            hemorrhoids were small and Grade II (internal                            hemorrhoids that prolapse but reduce spontaneously).                           The terminal ileum appeared normal.                           The exam was otherwise without abnormality on                            direct and retroflexion views. Complications:            No immediate complications. Estimated Blood Loss:     Estimated blood loss: none. Impression:               - One 6 mm polyp in the proximal ascending colon,                            removed with a cold snare. Resected and retrieved.                           - Moderate predominantly sigmoid diverticulosis.                           - Non-bleeding internal hemorrhoids.                           -  The examined portion of the ileum was normal.                           - The examination was otherwise normal on direct                            and retroflexion views. Recommendation:           - Patient has a contact number available for                            emergencies. The signs and symptoms of potential                            delayed complications were discussed with the                            patient. Return to normal activities tomorrow.                            Written discharge instructions were provided to  the                            patient.                           - Resume previous diet.                           - Use Benefiber one teaspoon PO daily with 8 ounces                            of water.                           - Await pathology results.                           - Repeat colonoscopy for surveillance based on                            pathology results.                           - The findings and recommendations were discussed                            with the patient's family. Jackquline Denmark, MD 02/02/2022 10:45:03 AM This report has been signed electronically.

## 2022-02-02 NOTE — Progress Notes (Signed)
Chief Complaint: for colon  Referring Provider:  Bonnita Nasuti, MD      ASSESSMENT AND PLAN;   #1. H/O polyps  Plan: -Colonoscopy -Again counseled her to quit smoking.   Discussed risks & benefits of colonoscopy. Risks including rare perforation req laparotomy, bleeding after bx/polypectomy req blood transfusion, rarely missing neoplasms, risks of anesthesia/sedation, rare risk of damage to internal organs. Benefits outweigh the risks. Patient agrees to proceed. All the questions were answered. Pt consents to proceed.   HPI:    Krystal Ellis is a 60 y.o. female  With HTN, HLD, chr bronchitis with continued smoking.  No nausea, vomiting, heartburn, regurgitation, odynophagia or dysphagia.  No significant diarrhea or constipation.  No melena or hematochezia. No unintentional weight loss. No abdominal pain.  rectal bleeding x 1 episode d/t hoids- resolved.  She also got a letter to repeat colon.  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  Past GI procedures Colonoscopy 07/2016 ([facility]) -Colon polyps s/p polypectomy. Bx- TA -Mild sigmoid diverticulosis. -Small internal hemorrhoids  Past Medical History:  Diagnosis Date   Adjustment disorder with depressed mood    Anxiety    Arthritis    both index fingers    Cancer ([place])    hx of precancerous lesions on back - removed    Essential hypertension 01/22/2017   Family history of adverse reaction to anesthesia    mother who is deceased had hx of N/V    Headache    hx of occasional migraine    HNP (herniated nucleus pulposus), lumbar 08/05/2015   Hyperlipidemia 01/22/2017   Hypertension    Kidney stones    Lumbago with sciatica, right side    Migraine with aura 01/22/2017   Multiple thyroid nodules 01/29/2017   Spinal stenosis of lumbar region 02/17/2015   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 Mild sigmoid divericulosis   LITHOTRIPSY     LUMBAR LAMINECTOMY/DECOMPRESSION MICRODISCECTOMY Bilateral 02/17/2015   Procedure: MICRO LUMBAR DECOMPRESSION L5 - S1 ON RIGHT POSSIBLE LEFT;  Surgeon: Susa Day, MD;  Location: [facility];  Service: Orthopedics;  Laterality: Bilateral;   LUMBAR LAMINECTOMY/DECOMPRESSION MICRODISCECTOMY Right 08/05/2015   Procedure: REVISION MICRO LUMBAR DECOMPRESSION L5 - S1 ON THE RIGHT ;  Surgeon: Susa Day, MD;  Location: [facility];  Service: Orthopedics;  Laterality: Right;   TUBAL LIGATION      Family History  Problem Relation Age of Onset   Cancer Mother    Cancer Father    Esophageal cancer Brother    Stomach cancer Maternal Aunt     Social History   Tobacco Use   Smoking status: Every Day    Packs/day: 0.50    Types: Cigarettes   Smokeless tobacco: Never  Vaping Use   Vaping Use: Never used  Substance Use Topics   Alcohol use: No   Drug use: No    Current Outpatient Medications  Medication Sig Dispense Refill   aspirin EC 81 MG tablet Take 1 tablet (81 mg total) by mouth daily. Start 4 days post-op     atorvastatin (LIPITOR) 40 MG tablet Take 40 mg by mouth daily.     carvedilol (COREG) 3.125 MG tablet Take 3.125 mg by mouth 2 (two) times daily.     FLUoxetine (PROZAC) 10 MG capsule Take 10 mg by mouth daily.  2  gabapentin (NEURONTIN) 300 MG capsule Take 300 mg by mouth 3 (three) times daily.     albuterol (VENTOLIN HFA) 108 (90 Base) MCG/ACT inhaler 1 puff every 4 (four) hours.     cyclobenzaprine (FLEXERIL) 10 MG tablet Take 10 mg by mouth at bedtime as needed. (Patient not taking: Reported on 02/02/2022)     eletriptan (RELPAX) 40 MG tablet Take 1 tablet by mouth daily as needed. (Patient not taking: Reported on 02/02/2022)     famotidine (PEPCID) 20 MG tablet Take 20 mg by mouth 2 (two) times daily. (Patient not taking: Reported on 01/26/2022)     fluticasone (FLONASE) 50 MCG/ACT nasal spray Place 1 spray  into both nostrils daily.     Fluticasone-Umeclidin-Vilant (TRELEGY ELLIPTA) 100-62.5-25 MCG/INH AEPB Inhale 1 puff into the lungs daily. (Patient not taking: Reported on 02/02/2022)     nicotine (NICODERM CQ - DOSED IN MG/24 HOURS) 21 mg/24hr patch 21 mg daily. (Patient not taking: Reported on 01/26/2022)     Omega-3 1000 MG CAPS Take by mouth.     Upadacitinib ER (RINVOQ) 15 MG TB24 Take 15 mg by mouth daily.     Vitamin D, Ergocalciferol, (DRISDOL) 1.25 MG (50000 UNIT) CAPS capsule Take 50,000 Units by mouth 3 (three) times a week.     Current Facility-Administered Medications  Medication Dose Route Frequency Provider Last Rate Last Admin   0.9 %  sodium chloride infusion  500 mL Intravenous Continuous Jackquline Denmark, MD        No Known Allergies  Review of Systems:  Constitutional: Denies fever, chills, diaphoresis, appetite change and fatigue.  HEENT: Denies photophobia, eye pain, redness, hearing loss, ear pain, congestion, sore throat, rhinorrhea, sneezing, mouth sores, neck pain, neck stiffness and tinnitus.   Respiratory: Denies SOB, DOE, cough, chest tightness,  and wheezing.   Cardiovascular: Denies chest pain, palpitations and leg swelling.  Genitourinary: Denies dysuria, urgency, frequency, hematuria, flank pain and difficulty urinating.  Musculoskeletal: Denies myalgias, back pain, joint swelling, arthralgias and gait problem.  Skin: No rash.  Neurological: Denies dizziness, seizures, syncope, weakness, light-headedness, numbness and headaches.  Hematological: Denies adenopathy. Easy bruising, personal or family bleeding history  Psychiatric/Behavioral: has anxiety or depression     Physical Exam:    BP (!) 126/54   Pulse 70   Temp 97.8 F (36.6 C)   Ht '5\' 5"'$  (1.651 m)   Wt 145 lb (65.8 kg)   SpO2 97%   BMI 24.13 kg/m  Wt Readings from Last 3 Encounters:  02/02/22 145 lb (65.8 kg)  01/26/22 145 lb (65.8 kg)  01/06/20 163 lb (73.9 kg)   Constitutional:   Well-developed, in no acute distress. Psychiatric: Normal mood and affect. Behavior is normal. HEENT: Pupils normal.  Conjunctivae are normal. No scleral icterus. Cardiovascular: Normal rate, regular rhythm. No edema Pulmonary/chest: Effort normal and breath sounds normal. No wheezing, rales or rhonchi. Abdominal: Soft, nondistended. Nontender. Bowel sounds active throughout. There are no masses palpable. No hepatomegaly. Neurological: Alert and oriented to person place and time. Skin: Skin is warm and dry. No rashes noted.       Carmell Austria, MD 02/02/2022, 10:09 AM  Cc: Bonnita Nasuti, MD

## 2022-02-02 NOTE — Progress Notes (Signed)
Vss nad trans to pacu °

## 2022-02-03 ENCOUNTER — Telehealth: Payer: Self-pay | Admitting: *Deleted

## 2022-02-03 NOTE — Telephone Encounter (Signed)
  Follow up Call-     02/02/2022    9:45 AM  Call back number  Post procedure Call Back phone  # [phone]  Permission to leave phone message Yes     Patient questions:   Wrong numbers. A man answered and denied knowing pt.

## 2022-02-12 ENCOUNTER — Encounter: Payer: Self-pay | Admitting: Gastroenterology
